# Patient Record
Sex: Male | Born: 1980 | Race: Black or African American | Hispanic: No | Marital: Married | Smoking: Former smoker
Health system: Southern US, Community
[De-identification: ages and names within clinical notes are randomized; demographics above are authoritative.]

## PROBLEM LIST (undated history)

## (undated) DIAGNOSIS — S93409A Sprain of unspecified ligament of unspecified ankle, initial encounter: Secondary | ICD-10-CM

## (undated) DIAGNOSIS — A549 Gonococcal infection, unspecified: Secondary | ICD-10-CM

---

## 1998-09-01 ENCOUNTER — Encounter: Payer: Self-pay | Admitting: Psychiatry

## 1998-09-01 ENCOUNTER — Emergency Department (HOSPITAL_COMMUNITY): Admission: EM | Admit: 1998-09-01 | Discharge: 1998-09-01 | Payer: Self-pay | Admitting: Emergency Medicine

## 2001-01-01 ENCOUNTER — Encounter: Payer: Self-pay | Admitting: Emergency Medicine

## 2001-01-01 ENCOUNTER — Emergency Department (HOSPITAL_COMMUNITY): Admission: EM | Admit: 2001-01-01 | Discharge: 2001-01-01 | Payer: Self-pay | Admitting: Emergency Medicine

## 2004-01-29 ENCOUNTER — Emergency Department (HOSPITAL_COMMUNITY): Admission: EM | Admit: 2004-01-29 | Discharge: 2004-01-29 | Payer: Self-pay | Admitting: Emergency Medicine

## 2004-08-06 ENCOUNTER — Emergency Department (HOSPITAL_COMMUNITY): Admission: EM | Admit: 2004-08-06 | Discharge: 2004-08-06 | Payer: Self-pay | Admitting: Emergency Medicine

## 2005-03-29 ENCOUNTER — Emergency Department (HOSPITAL_COMMUNITY): Admission: EM | Admit: 2005-03-29 | Discharge: 2005-03-29 | Payer: Self-pay | Admitting: Emergency Medicine

## 2005-07-24 ENCOUNTER — Emergency Department (HOSPITAL_COMMUNITY): Admission: EM | Admit: 2005-07-24 | Discharge: 2005-07-24 | Payer: Self-pay | Admitting: *Deleted

## 2005-09-20 ENCOUNTER — Emergency Department (HOSPITAL_COMMUNITY): Admission: EM | Admit: 2005-09-20 | Discharge: 2005-09-20 | Payer: Self-pay | Admitting: Emergency Medicine

## 2007-02-15 ENCOUNTER — Emergency Department (HOSPITAL_COMMUNITY): Admission: EM | Admit: 2007-02-15 | Discharge: 2007-02-15 | Payer: Self-pay | Admitting: Emergency Medicine

## 2008-02-03 ENCOUNTER — Emergency Department (HOSPITAL_COMMUNITY): Admission: EM | Admit: 2008-02-03 | Discharge: 2008-02-04 | Payer: Self-pay | Admitting: Emergency Medicine

## 2008-10-08 ENCOUNTER — Emergency Department (HOSPITAL_COMMUNITY): Admission: EM | Admit: 2008-10-08 | Discharge: 2008-10-08 | Payer: Self-pay | Admitting: Emergency Medicine

## 2010-09-24 ENCOUNTER — Emergency Department (HOSPITAL_COMMUNITY)
Admission: EM | Admit: 2010-09-24 | Discharge: 2010-09-24 | Disposition: A | Discharge status: Home / Self Care | Payer: Self-pay | Attending: Emergency Medicine | Admitting: Emergency Medicine

## 2010-09-24 DIAGNOSIS — N342 Other urethritis: Secondary | ICD-10-CM | POA: Insufficient documentation

## 2010-09-27 LAB — GC/CHLAMYDIA PROBE AMP, GENITAL
Chlamydia, DNA Probe: NEGATIVE
GC Probe Amp, Genital: POSITIVE — AB

## 2010-12-06 LAB — GC/CHLAMYDIA PROBE AMP, GENITAL
Chlamydia, DNA Probe: NEGATIVE
GC Probe Amp, Genital: POSITIVE — AB

## 2011-05-18 LAB — URINALYSIS, ROUTINE W REFLEX MICROSCOPIC
Bilirubin Urine: NEGATIVE
Glucose, UA: NEGATIVE
Hgb urine dipstick: NEGATIVE
Ketones, ur: NEGATIVE
Nitrite: NEGATIVE
Protein, ur: NEGATIVE
Specific Gravity, Urine: 1.025
Urobilinogen, UA: 1
pH: 7.5

## 2011-05-18 LAB — GC/CHLAMYDIA PROBE AMP, GENITAL
Chlamydia, DNA Probe: NEGATIVE
GC Probe Amp, Genital: POSITIVE — AB

## 2011-05-18 LAB — RPR: RPR Ser Ql: NONREACTIVE

## 2011-09-22 ENCOUNTER — Emergency Department (INDEPENDENT_AMBULATORY_CARE_PROVIDER_SITE_OTHER)
Admission: EM | Admit: 2011-09-22 | Discharge: 2011-09-22 | Disposition: A | Discharge status: Home / Self Care | Payer: Self-pay | Source: Home / Self Care | Attending: Emergency Medicine | Admitting: Emergency Medicine

## 2011-09-22 ENCOUNTER — Emergency Department (INDEPENDENT_AMBULATORY_CARE_PROVIDER_SITE_OTHER): Payer: Self-pay

## 2011-09-22 ENCOUNTER — Encounter (HOSPITAL_COMMUNITY): Payer: Self-pay | Admitting: *Deleted

## 2011-09-22 DIAGNOSIS — M948X9 Other specified disorders of cartilage, unspecified sites: Secondary | ICD-10-CM

## 2011-09-22 DIAGNOSIS — M2580 Other specified joint disorders, unspecified joint: Secondary | ICD-10-CM

## 2011-09-22 HISTORY — DX: Sprain of unspecified ligament of unspecified ankle, initial encounter: S93.409A

## 2011-09-22 MED ORDER — HYDROCODONE-ACETAMINOPHEN 5-325 MG PO TABS: 2.0000 | ORAL_TABLET | ORAL | Status: AC | PRN

## 2011-09-22 MED ORDER — IBUPROFEN 800 MG PO TABS: 800.0000 mg | ORAL_TABLET | Freq: Three times a day (TID) | ORAL | Status: AC | PRN

## 2011-09-22 NOTE — ED Provider Notes (Addendum)
History     CSN: [id number]  Arrival date & time 09/22/11  1733   First MD Initiated Contact with Patient 09/22/11 1744      Chief Complaint  Patient presents with  . Foot Pain    (Consider location/radiation/quality/duration/timing/severity/associated sxs/prior treatment) HPI Comments: Patient states that he started running recently. States he was running in an old pair shoes on a concrete sidewalk, now complaints of achy pain, swelling at left first MTP joint. No hyperesthesias, redness, bruising, numbness. Is able to move all toes, although some limitation of motion of the first toe due to pain. No pain along ankle, along plantar fascia, midfoot, or fifth metatarsal. Patient tried 400 mg of ibuprofen without relief. No history of gout.  Patient is a 31 y.o. male presenting with lower extremity pain.  Foot Pain The current episode started 2 days ago. The problem occurs constantly. The problem has not changed since onset.The symptoms are aggravated by walking. The symptoms are relieved by nothing.    Past Medical History  Diagnosis Date  . Ankle sprain     History reviewed. No pertinent past surgical history.  History reviewed. No pertinent family history.  History  Substance Use Topics  . Smoking status: Current Everyday Smoker -- 1.0 packs/day    Types: Cigarettes  . Smokeless tobacco: Not on file  . Alcohol Use: Yes     occassional      Review of Systems  Constitutional: Negative for fever.  Gastrointestinal: Negative for nausea.  Musculoskeletal: Positive for joint swelling and arthralgias.  Skin: Negative for color change and wound.  Neurological: Negative for numbness.    Allergies  Review of patient's allergies indicates no known allergies.  Home Medications   Current Outpatient Rx  Name Route Sig Dispense Refill  . HYDROCODONE-ACETAMINOPHEN 5-325 MG PO TABS Oral Take 2 tablets by mouth every 4 (four) hours as needed for pain. 20 tablet 0  . IBUPROFEN  800 MG PO TABS Oral Take 1 tablet (800 mg total) by mouth every 8 (eight) hours as needed for pain. 30 tablet 0    BP 135/80  Pulse 82  Temp(Src) 98.7 F (37.1 C) (Oral)  Resp 18  SpO2 100%  Physical Exam  Nursing note and vitals reviewed. Constitutional: He is oriented to person, place, and time. He appears well-developed and well-nourished.  HENT:  Head: Normocephalic and atraumatic.  Eyes: EOM are normal.  Neck: Normal range of motion.  Cardiovascular: Normal rate.   Pulmonary/Chest: Effort normal. No respiratory distress.  Abdominal: He exhibits no distension.  Musculoskeletal: Normal range of motion.       Pain, swelling at left first MTP. Pain on plantar aspect of first MTP. No pain on dorsal aspect. Patient able to move toes, however, pain with passive flexion/extension of great toe. Ankle, midfoot, fifth metatarsal all within normal limits. DP 2+. No bruising. Skin intact.  Neurological: He is alert and oriented to person, place, and time.  Skin: Skin is warm and dry.  Psychiatric: He has a normal mood and affect. His behavior is normal.    ED Course  Procedures (including critical care time)  Labs Reviewed - No data to display Dg Foot Complete Left  09/22/2011  *RADIOLOGY REPORT*  Clinical Data: Pain possible running injury.  LEFT FOOT - COMPLETE 3+ VIEW  Comparison: None.  Findings: No acute bony or joint abnormality is seen. Bipartite medial sesamoid is noted.  IMPRESSION: Negative exam.  Original Report Authenticated By: Bernadene Bell. Maricela Curet, M.D.  1. Sesamoiditis       MDM  Imaging reviewed by myself. Report per radiologist. Appears to have a bipartate sesamoid. No acute fracture per radiology. Will place in crutches, ace wrap, stiff soled shoe. Will have him followup with orthopedics in 10 days if no improvement with conservative treatment. Discussed imaging and fact that he may have a stress fracture not seen on today's x-ray, & plan with patient. Patient  agrees with plan.   Luiz Blare, MD 09/22/11 1846  Luiz Blare, MD 09/22/11 1910

## 2011-09-22 NOTE — ED Notes (Signed)
Report given to Charlesetta Garibaldi, RN

## 2011-09-22 NOTE — ED Notes (Signed)
Pt states that he started running Wednesday (had not done it in a while).  Afterwards noted that he was having pain ball of left foot.  States pain is progressively getting worse, throbbing and can't bear weight on it.

## 2013-03-12 ENCOUNTER — Emergency Department (HOSPITAL_COMMUNITY)
Admission: EM | Admit: 2013-03-12 | Discharge: 2013-03-12 | Disposition: A | Discharge status: Home / Self Care | Payer: BC Managed Care – PPO | Source: Home / Self Care | Attending: Family Medicine | Admitting: Family Medicine

## 2013-03-12 ENCOUNTER — Emergency Department (HOSPITAL_COMMUNITY)
Admission: EM | Admit: 2013-03-12 | Discharge: 2013-03-12 | Disposition: A | Discharge status: Home / Self Care | Payer: Self-pay

## 2013-03-12 ENCOUNTER — Encounter (HOSPITAL_COMMUNITY): Payer: Self-pay

## 2013-03-12 DIAGNOSIS — K047 Periapical abscess without sinus: Secondary | ICD-10-CM

## 2013-03-12 MED ORDER — CLINDAMYCIN HCL 300 MG PO CAPS: 300.0000 mg | ORAL_CAPSULE | Freq: Three times a day (TID) | ORAL | Status: DC

## 2013-03-12 MED ORDER — DICLOFENAC POTASSIUM 50 MG PO TABS: 50.0000 mg | ORAL_TABLET | Freq: Three times a day (TID) | ORAL | Status: DC

## 2013-03-12 NOTE — ED Provider Notes (Signed)
   History    CSN: [id number] Arrival date & time 03/12/13  1321  First MD Initiated Contact with Patient 03/12/13 1419     Chief Complaint  Patient presents with  . Dental Problem   (Consider location/radiation/quality/duration/timing/severity/associated sxs/prior Treatment) Patient is a 32 y.o. male presenting with tooth pain. The history is provided by the patient.  Dental Pain Location:  Lower Lower teeth location:  19/LL 1st molar Quality:  Throbbing Severity:  Moderate Onset quality:  Sudden Duration:  1 day Progression:  Worsening Chronicity:  New Context: abscess and dental caries   Associated symptoms: facial swelling   Risk factors: lack of dental care and smoking    Past Medical History  Diagnosis Date  . Ankle sprain    History reviewed. No pertinent past surgical history. History reviewed. No pertinent family history. History  Substance Use Topics  . Smoking status: Current Every Day Smoker -- 1.00 packs/day    Types: Cigarettes  . Smokeless tobacco: Not on file  . Alcohol Use: Yes     Comment: occassional    Review of Systems  Constitutional: Negative.   HENT: Positive for facial swelling and dental problem.     Allergies  Review of patient's allergies indicates no known allergies.  Home Medications   Current Outpatient Rx  Name  Route  Sig  Dispense  Refill  . clindamycin (CLEOCIN) 300 MG capsule   Oral   Take 1 capsule (300 mg total) by mouth 3 (three) times daily.   21 capsule   0   . diclofenac (CATAFLAM) 50 MG tablet   Oral   Take 1 tablet (50 mg total) by mouth 3 (three) times daily.   30 tablet   0    BP 135/88  Pulse 73  Temp(Src) 98 F (36.7 C) (Oral)  Resp 16  SpO2 100% Physical Exam  Nursing note and vitals reviewed. Constitutional: He is oriented to person, place, and time. He appears well-developed and well-nourished. He appears distressed.  HENT:  Left Ear: External ear normal.  Mouth/Throat: Oropharynx is clear  and moist. Abnormal dentition. Dental abscesses and dental caries present.    Eyes: Conjunctivae are normal. Pupils are equal, round, and reactive to light.  Neck: Normal range of motion. Neck supple.  Lymphadenopathy:    He has cervical adenopathy.  Neurological: He is alert and oriented to person, place, and time.  Skin: Skin is warm and dry.    ED Course  Procedures (including critical care time) Labs Reviewed - No data to display No results found. 1. Dental abscess     MDM    Linna Hoff, MD 03/12/13 [phone]

## 2013-03-12 NOTE — ED Notes (Signed)
Reports 2 day duration of pain and swelling left lower jaw; minimal relief w motrin

## 2014-09-27 ENCOUNTER — Emergency Department (HOSPITAL_COMMUNITY)
Admission: EM | Admit: 2014-09-27 | Discharge: 2014-09-27 | Disposition: A | Discharge status: Home / Self Care | Payer: Self-pay | Attending: Emergency Medicine | Admitting: Emergency Medicine

## 2014-09-27 ENCOUNTER — Encounter (HOSPITAL_COMMUNITY): Payer: Self-pay | Admitting: Emergency Medicine

## 2014-09-27 DIAGNOSIS — Z720 Tobacco use: Secondary | ICD-10-CM | POA: Insufficient documentation

## 2014-09-27 DIAGNOSIS — Z791 Long term (current) use of non-steroidal anti-inflammatories (NSAID): Secondary | ICD-10-CM | POA: Insufficient documentation

## 2014-09-27 DIAGNOSIS — Z87828 Personal history of other (healed) physical injury and trauma: Secondary | ICD-10-CM | POA: Insufficient documentation

## 2014-09-27 DIAGNOSIS — R0981 Nasal congestion: Secondary | ICD-10-CM | POA: Insufficient documentation

## 2014-09-27 DIAGNOSIS — R05 Cough: Secondary | ICD-10-CM | POA: Insufficient documentation

## 2014-09-27 DIAGNOSIS — Z792 Long term (current) use of antibiotics: Secondary | ICD-10-CM | POA: Insufficient documentation

## 2014-09-27 MED ORDER — PSEUDOEPHEDRINE HCL ER 120 MG PO TB12
120.0000 mg | ORAL_TABLET | Freq: Two times a day (BID) | ORAL | Status: DC
  Administered 2014-09-27: 120 mg via ORAL
  Filled 2014-09-27: qty 1

## 2014-09-27 MED ORDER — PSEUDOEPHEDRINE HCL ER 120 MG PO TB12: 120.0000 mg | ORAL_TABLET | Freq: Two times a day (BID) | ORAL | Status: AC

## 2014-09-27 NOTE — ED Notes (Signed)
Pt states "I've had a cold for about 2 weeks and it doesn't seem to be going away." Pt c/o nasal congestion, sore throat, and productive cough, states he is coughing up green mucus. Denies chest pain or SOB.

## 2014-09-27 NOTE — ED Provider Notes (Signed)
CSN: [id number]     Arrival date & time 09/27/14  0017 History   First MD Initiated Contact with Patient 09/27/14 0028     Chief Complaint  Patient presents with  . Nasal Congestion     (Consider location/radiation/quality/duration/timing/severity/associated sxs/prior Treatment) HPI Comments: Patient has had URI symptoms proximally 2 weeks.  He has 7 the last 24 hours, that when he wakes in the morning.  His, puffy right eye with some crusting of the lid margin.  This does not occur during the day while he is awake.  He also notices some facial discomfort when leaning over.  He has not taken any medication for his URI symptoms.  Denies any fever.  Does state that he has an occasional productive cough but no wheezing  The history is provided by the patient.    Past Medical History  Diagnosis Date  . Ankle sprain    History reviewed. No pertinent past surgical history. No family history on file. History  Substance Use Topics  . Smoking status: Current Every Day Smoker -- 0.50 packs/day    Types: Cigarettes  . Smokeless tobacco: Not on file  . Alcohol Use: Yes     Comment: occassional    Review of Systems  Constitutional: Negative for fever.  HENT: Positive for congestion.   Eyes: Negative for visual disturbance.  Respiratory: Positive for cough. Negative for shortness of breath and wheezing.   Neurological: Negative for dizziness and headaches.      Allergies  Review of patient's allergies indicates no known allergies.  Home Medications   Prior to Admission medications   Medication Sig Start Date End Date Taking? Authorizing Provider  clindamycin (CLEOCIN) 300 MG capsule Take 1 capsule (300 mg total) by mouth 3 (three) times daily. 03/12/13   Linna HoffJames D Kindl, MD  diclofenac (CATAFLAM) 50 MG tablet Take 1 tablet (50 mg total) by mouth 3 (three) times daily. 03/12/13   Linna HoffJames D Kindl, MD  pseudoephedrine (SUDAFED) 120 MG 12 hr tablet Take 1 tablet (120 mg total) by mouth 2  (two) times daily. 09/27/14 10/04/14  Arman FilterGail K Chole Driver, NP   BP 144/79 mmHg  Pulse 95  Temp(Src) 98.3 F (36.8 C) (Oral)  Resp 16  Ht 5\' 9"  (1.753 m)  Wt 235 lb (106.595 kg)  BMI 34.69 kg/m2  SpO2 98% Physical Exam  Constitutional: He appears well-developed and well-nourished.  HENT:  Head: Normocephalic.  Right Ear: External ear normal.  Left Ear: External ear normal.  Nose: Left sinus exhibits frontal sinus tenderness.  Eyes: Pupils are equal, round, and reactive to light.  Neck: Normal range of motion.  Cardiovascular: Normal rate.   Pulmonary/Chest: Effort normal. He has no wheezes.  Musculoskeletal: Normal range of motion.  Lymphadenopathy:    He has no cervical adenopathy.  Neurological: He is alert.  Nursing note and vitals reviewed.   ED Course  Procedures (including critical care time) Labs Review Labs Reviewed - No data to display  Imaging Review No results found.   EKG Interpretation None     Will give Rx for Sudafed  MDM   Final diagnoses:  Sinus congestion        Arman FilterGail K Kaliyah Gladman, NP 09/27/14 [id number]  Raeford RazorStephen Kohut, MD 09/27/14 [phone]

## 2015-11-25 ENCOUNTER — Emergency Department (HOSPITAL_COMMUNITY)
Admission: EM | Admit: 2015-11-25 | Discharge: 2015-11-25 | Disposition: A | Discharge status: Home / Self Care | Payer: Self-pay | Attending: Emergency Medicine | Admitting: Emergency Medicine

## 2015-11-25 ENCOUNTER — Emergency Department (HOSPITAL_COMMUNITY): Payer: Self-pay

## 2015-11-25 ENCOUNTER — Encounter (HOSPITAL_COMMUNITY): Payer: Self-pay | Admitting: Emergency Medicine

## 2015-11-25 DIAGNOSIS — M25572 Pain in left ankle and joints of left foot: Secondary | ICD-10-CM | POA: Insufficient documentation

## 2015-11-25 DIAGNOSIS — Z87828 Personal history of other (healed) physical injury and trauma: Secondary | ICD-10-CM | POA: Insufficient documentation

## 2015-11-25 DIAGNOSIS — Z791 Long term (current) use of non-steroidal anti-inflammatories (NSAID): Secondary | ICD-10-CM | POA: Insufficient documentation

## 2015-11-25 DIAGNOSIS — Z792 Long term (current) use of antibiotics: Secondary | ICD-10-CM | POA: Insufficient documentation

## 2015-11-25 DIAGNOSIS — F17210 Nicotine dependence, cigarettes, uncomplicated: Secondary | ICD-10-CM | POA: Insufficient documentation

## 2015-11-25 MED ORDER — IBUPROFEN 800 MG PO TABS: 800.0000 mg | ORAL_TABLET | Freq: Three times a day (TID) | ORAL | Status: DC

## 2015-11-25 NOTE — ED Provider Notes (Signed)
CSN: [id number]     Arrival date & time 11/25/15  1155 History   First MD Initiated Contact with Patient 11/25/15 1235     No chief complaint on file.    (Consider location/radiation/quality/duration/timing/severity/associated sxs/prior Treatment) HPI   35 year old male who presents with complaints of left foot pain. Patient noticed intermittent pain to the joint near the left great toe for the past 4 days. Pain is described as a sharp aching sensation worsening with ambulation and improves with rest. Pain is nonradiating and no specific treatment tried. He denies any injury but states that he has been working out and thinks he may have overdone it. No fever or rash. No history of gout.  Past Medical History  Diagnosis Date  . Ankle sprain    History reviewed. No pertinent past surgical history. No family history on file. Social History  Substance Use Topics  . Smoking status: Current Every Day Smoker -- 0.50 packs/day    Types: Cigarettes  . Smokeless tobacco: None  . Alcohol Use: Yes     Comment: occassional    Review of Systems  Constitutional: Negative for fever.  Musculoskeletal: Positive for arthralgias.  Neurological: Negative for numbness.      Allergies  Review of patient's allergies indicates no known allergies.  Home Medications   Prior to Admission medications   Medication Sig Start Date End Date Taking? Authorizing Provider  clindamycin (CLEOCIN) 300 MG capsule Take 1 capsule (300 mg total) by mouth 3 (three) times daily. 03/12/13   Linna Hoff, MD  diclofenac (CATAFLAM) 50 MG tablet Take 1 tablet (50 mg total) by mouth 3 (three) times daily. 03/12/13   Linna Hoff, MD   BP 135/94 mmHg  Pulse 85  Temp(Src) 97.9 F (36.6 C) (Oral)  Resp 16  Ht  (1.753 m)  Wt 115.667 kg  BMI 37.64 kg/m2  SpO2 98% Physical Exam  Constitutional: He appears well-developed and well-nourished. No distress.  HENT:  Head: Atraumatic.  Eyes: Conjunctivae are normal.   Neck: Neck supple.  Musculoskeletal: He exhibits tenderness (L foot: mild tenderness to 1st Metatarsal joint on palpation.  no erythema, no edema, no deformity.  brisk cap refills to all toes, dorsalis pedis pulse palpable.  ).  Neurological: He is alert.  Skin: No rash noted.  Psychiatric: He has a normal mood and affect.  Nursing note and vitals reviewed.   ED Course  Procedures (including critical care time) Labs Review Labs Reviewed - No data to display  Imaging Review Dg Foot Complete Left  11/25/2015  CLINICAL DATA:  Left foot pain for 4 days EXAM: LEFT FOOT - COMPLETE 3+ VIEW COMPARISON:  09/22/2011 FINDINGS: No acute fracture or dislocation is noted. A well corticated bony density is noted adjacent to the medial malleolus consistent with prior trauma and nonunion. No soft tissue abnormality is seen. IMPRESSION: No acute abnormality noted. Electronically Signed   By: Alcide Clever M.D.   On: 11/25/2015 13:02   I have personally reviewed and evaluated these images and lab results as part of my medical decision-making.   EKG Interpretation None      MDM   Final diagnoses:  Arthralgia of left foot   BP 135/94 mmHg  Pulse 85  Temp(Src) 97.9 F (36.6 C) (Oral)  Resp 16  Ht  (1.753 m)  Wt 115.667 kg  BMI 37.64 kg/m2  SpO2 98%  Pt has tenderness to 1st MTJ without signs of gout or deformity.  Xray neg.  Will treat arthralgias.  Return precaution discussed     Fayrene HelperBowie Joury Allcorn, PA-C 11/25/15 1317  Benjiman CoreNathan Pickering, MD 11/26/15 [phone]

## 2015-11-25 NOTE — ED Notes (Signed)
C/o left foot pain x 4 days. Thinks it from working out.

## 2015-11-25 NOTE — Discharge Instructions (Signed)

## 2016-05-25 ENCOUNTER — Emergency Department (HOSPITAL_COMMUNITY)
Admission: EM | Admit: 2016-05-25 | Discharge: 2016-05-25 | Disposition: A | Discharge status: Home / Self Care | Payer: Self-pay | Attending: Emergency Medicine | Admitting: Emergency Medicine

## 2016-05-25 ENCOUNTER — Encounter (HOSPITAL_COMMUNITY): Payer: Self-pay

## 2016-05-25 DIAGNOSIS — M79661 Pain in right lower leg: Secondary | ICD-10-CM

## 2016-05-25 DIAGNOSIS — Z79899 Other long term (current) drug therapy: Secondary | ICD-10-CM | POA: Insufficient documentation

## 2016-05-25 DIAGNOSIS — M79604 Pain in right leg: Secondary | ICD-10-CM | POA: Insufficient documentation

## 2016-05-25 DIAGNOSIS — F17210 Nicotine dependence, cigarettes, uncomplicated: Secondary | ICD-10-CM | POA: Insufficient documentation

## 2016-05-25 MED ORDER — HYDROCODONE-ACETAMINOPHEN 5-325 MG PO TABS: 2.0000 | ORAL_TABLET | ORAL | 0 refills | Status: DC | PRN

## 2016-05-25 MED ORDER — METHOCARBAMOL 500 MG PO TABS: 500.0000 mg | ORAL_TABLET | Freq: Four times a day (QID) | ORAL | 0 refills | Status: DC

## 2016-05-25 MED ORDER — ENOXAPARIN SODIUM 120 MG/0.8ML ~~LOC~~ SOLN
1.0000 mg/kg | Freq: Once | SUBCUTANEOUS | Status: AC
  Administered 2016-05-25: 105 mg via SUBCUTANEOUS
  Filled 2016-05-25: qty 0.8

## 2016-05-25 NOTE — Progress Notes (Signed)
EDCM spoke to patient at bedside. Patient confirms she does not have a pcp or insurance living in [place].  EDCM provided patient with contact infromation to [facility], informed patient of services there and walk in times.  EDCM also provided patient with list of pcps who accept self pay patients, list of discount pharmacies and websites needymeds.org and GoodRX.com for medication assistance, phone number to inquire about the orange card, phone number to inquire about Medicaid, phone number to inquire about the Affordable Care Act, financial resources in the community such as local churches, [employer], urban ministries, and dental assistance for uninsured patients.  Patient thankful for resources.  No further EDCM needs at this time. 

## 2016-05-25 NOTE — ED Triage Notes (Signed)
Pt complains of right lower leg pain for a week, he thinks he injured something lifting weights, his leg is sore and swollen

## 2016-05-25 NOTE — ED Provider Notes (Signed)
[facility]-EMERGENCY DEPT Provider Note   CSN: [id number] Arrival date & time: 05/25/16  2006     History   Chief Complaint Chief Complaint  Patient presents with  . Leg Pain    HPI Seth Cunningham is a 35 y.o. male.  35 year old male complains of severe right calf pain and began week and half ago after he was doing leg extensions at the gym. Patient also states he was doing a squat 500 pounds and thinks he could've also injured it that way as well 2. Denies any knee pain. No hip or back pain. Pain is localized to his right calf and now has swelling associated with it. States he has some pain with plantar foot flexion. Denies any shortness of breath or pleuritic chest pain. Pain better with rest. Using over-the-counter medications without relief      Past Medical History:  Diagnosis Date  . Ankle sprain     There are no active problems to display for this patient.   History reviewed. No pertinent surgical history.     Home Medications    Prior to Admission medications   Medication Sig Start Date End Date Taking? Authorizing Provider  clindamycin (CLEOCIN) 300 MG capsule Take 1 capsule (300 mg total) by mouth 3 (three) times daily. Patient not taking: Reported on 05/25/2016 03/12/13   Linna Hoff, MD  diclofenac (CATAFLAM) 50 MG tablet Take 1 tablet (50 mg total) by mouth 3 (three) times daily. Patient not taking: Reported on 05/25/2016 03/12/13   Linna Hoff, MD  ibuprofen (ADVIL,MOTRIN) 800 MG tablet Take 1 tablet (800 mg total) by mouth 3 (three) times daily. Patient not taking: Reported on 05/25/2016 11/25/15   Fayrene Helper, PA-C    Family History History reviewed. No pertinent family history.  Social History Social History  Substance Use Topics  . Smoking status: Current Every Day Smoker    Packs/day: 0.50    Types: Cigarettes  . Smokeless tobacco: Never Used  . Alcohol use Yes     Comment: occassional     Allergies   Review of patient's allergies indicates  no known allergies.   Review of Systems Review of Systems  All other systems reviewed and are negative.    Physical Exam Updated Vital Signs BP (!) 118/47   Pulse 70   Temp 97.9 F (36.6 C) (Oral)   Resp 18   SpO2 97%   Physical Exam  Constitutional: He is oriented to person, place, and time. He appears well-developed and well-nourished.  Non-toxic appearance. No distress.  HENT:  Head: Normocephalic and atraumatic.  Eyes: Conjunctivae, EOM and lids are normal. Pupils are equal, round, and reactive to light.  Neck: Normal range of motion. Neck supple. No tracheal deviation present. No thyroid mass present.  Cardiovascular: Normal rate, regular rhythm and normal heart sounds.  Exam reveals no gallop.   No murmur heard. Pulmonary/Chest: Effort normal and breath sounds normal. No stridor. No respiratory distress. He has no decreased breath sounds. He has no wheezes. He has no rhonchi. He has no rales.  Abdominal: Soft. Normal appearance and bowel sounds are normal. He exhibits no distension. There is no tenderness. There is no rebound and no CVA tenderness.  Musculoskeletal: Normal range of motion. He exhibits no edema or tenderness.       Right lower leg: He exhibits swelling.       Legs: Edema noted from the right calf down to the ankle.  Neurological: He is alert and oriented to  person, place, and time. He has normal strength. No cranial nerve deficit or sensory deficit. GCS eye subscore is 4. GCS verbal subscore is 5. GCS motor subscore is 6.  Skin: Skin is warm and dry. No abrasion and no rash noted.  Psychiatric: He has a normal mood and affect. His speech is normal and behavior is normal.  Nursing note and vitals reviewed.    ED Treatments / Results  Labs (all labs ordered are listed, but only abnormal results are displayed) Labs Reviewed - No data to display  EKG  EKG Interpretation None       Radiology No results found.  Procedures Procedures (including  critical care time)  Medications Ordered in ED Medications - No data to display   Initial Impression / Assessment and Plan / ED Course  I have reviewed the triage vital signs and the nursing notes.  Pertinent labs & imaging results that were available during my care of the patient were reviewed by me and considered in my medical decision making (see chart for details).  Clinical Course    Patient likely with calf Tendon injury. However concern for possible DVT. Will medicate Lovenox here and have patient return to [place] for an outpatient Doppler study  Final Clinical Impressions(s) / ED Diagnoses   Final diagnoses:  None    New Prescriptions New Prescriptions   No medications on file     Lorre NickAnthony Hester Forget, MD 05/25/16 2109

## 2016-05-26 ENCOUNTER — Ambulatory Visit (HOSPITAL_COMMUNITY)
Admission: RE | Admit: 2016-05-26 | Discharge: 2016-05-26 | Disposition: A | Discharge status: Home / Self Care | Payer: Self-pay | Source: Ambulatory Visit | Attending: Emergency Medicine | Admitting: Emergency Medicine

## 2016-05-26 DIAGNOSIS — M79609 Pain in unspecified limb: Secondary | ICD-10-CM

## 2016-05-26 DIAGNOSIS — M79661 Pain in right lower leg: Secondary | ICD-10-CM | POA: Insufficient documentation

## 2016-05-26 NOTE — Progress Notes (Signed)
VASCULAR LAB PRELIMINARY  PRELIMINARY  PRELIMINARY  PRELIMINARY  Right lower extremity venous duplex completed.    Preliminary report:  There is no obvious evidence of DVT or SVT noted in the right lower extremity.   Janne Faulk, RVT 05/26/2016, 9:11 AM

## 2016-06-08 ENCOUNTER — Emergency Department (HOSPITAL_COMMUNITY)
Admission: EM | Admit: 2016-06-08 | Discharge: 2016-06-09 | Disposition: A | Discharge status: Home / Self Care | Payer: Self-pay | Attending: Dermatology | Admitting: Dermatology

## 2016-06-08 ENCOUNTER — Encounter (HOSPITAL_COMMUNITY): Payer: Self-pay | Admitting: *Deleted

## 2016-06-08 DIAGNOSIS — M25561 Pain in right knee: Secondary | ICD-10-CM | POA: Insufficient documentation

## 2016-06-08 DIAGNOSIS — Z87891 Personal history of nicotine dependence: Secondary | ICD-10-CM | POA: Insufficient documentation

## 2016-06-08 DIAGNOSIS — Z5321 Procedure and treatment not carried out due to patient leaving prior to being seen by health care provider: Secondary | ICD-10-CM | POA: Insufficient documentation

## 2016-06-08 NOTE — ED Notes (Signed)
Bed: [facility] Expected date:  Expected time:  Means of arrival:  Comments: 

## 2016-06-08 NOTE — ED Notes (Signed)
Bed: [facility] Expected date:  Expected time:  Means of arrival:  Comments: 

## 2016-06-08 NOTE — ED Triage Notes (Signed)
Pt called from triage, no answer 

## 2016-06-08 NOTE — ED Triage Notes (Signed)
Patient is alert and oriented x4.  He is complaining of right knee pain that started about 2 weeks ago.  Patient states that he thinks it happened while working out.  Patient adds that he feels swelling in the knee with decreased range of motion.  Currently he rates his pain 6 of 10.

## 2016-06-15 ENCOUNTER — Encounter (HOSPITAL_COMMUNITY): Payer: Self-pay | Admitting: Emergency Medicine

## 2016-06-15 ENCOUNTER — Emergency Department (HOSPITAL_COMMUNITY)
Admission: EM | Admit: 2016-06-15 | Discharge: 2016-06-15 | Disposition: A | Discharge status: Home / Self Care | Payer: Self-pay | Attending: Emergency Medicine | Admitting: Emergency Medicine

## 2016-06-15 DIAGNOSIS — Z87891 Personal history of nicotine dependence: Secondary | ICD-10-CM | POA: Insufficient documentation

## 2016-06-15 DIAGNOSIS — Y9389 Activity, other specified: Secondary | ICD-10-CM | POA: Insufficient documentation

## 2016-06-15 DIAGNOSIS — Z79899 Other long term (current) drug therapy: Secondary | ICD-10-CM | POA: Insufficient documentation

## 2016-06-15 DIAGNOSIS — Y999 Unspecified external cause status: Secondary | ICD-10-CM | POA: Insufficient documentation

## 2016-06-15 DIAGNOSIS — Y929 Unspecified place or not applicable: Secondary | ICD-10-CM | POA: Insufficient documentation

## 2016-06-15 DIAGNOSIS — X58XXXA Exposure to other specified factors, initial encounter: Secondary | ICD-10-CM | POA: Insufficient documentation

## 2016-06-15 DIAGNOSIS — S8991XA Unspecified injury of right lower leg, initial encounter: Secondary | ICD-10-CM | POA: Insufficient documentation

## 2016-06-15 NOTE — ED Triage Notes (Signed)
Pt states that he cannot fully extend rt knee due to pain.  Started hurting one month ago.  Pt states that he is unable to do any "explosive energy" exercises.  Pt is Pharmacist, [employer].

## 2016-06-15 NOTE — Discharge Instructions (Signed)
Read the information below.  You may return to the Emergency Department at any time for worsening condition or any new symptoms that concern you.  If you develop uncontrolled pain, weakness or numbness of the extremity, severe discoloration of the skin, or you are unable to walk or move your knee, return to the ER for a recheck.    °

## 2016-06-15 NOTE — ED Provider Notes (Signed)
[facility]-EMERGENCY DEPT Provider Note   CSN: [id number] Arrival date & time: 06/15/16  1455  By signing my name below, I, Soijett Blue, attest that this documentation has been prepared under the direction and in the presence of Trixie Dredge, PA-C Electronically Signed: Soijett Blue, ED Scribe. 06/15/16. 3:42 PM.   History   Chief Complaint Chief Complaint  Patient presents with  . Knee Pain    HPI Seth Cunningham is a 35 y.o. male who presents to the Emergency Department complaining of right knee pain onset 1 month ago. Pt notes that he was completing squats prior to the onset of his symptoms and he denies overexerting himself, but notes that he was rushing through the squats. Pt reports that he feels a "catch" sensation to his right knee with ambulation and he notes that he also has a tugging sensation to his right knee when he attempts squatting. Denies recent infections or treatment. Denies recent falls. Pt is having associated symptoms of mild right knee swelling and right lower leg swelling that occurs only at night. He notes that he has not tried any medications for the relief of his symptoms. He denies color change, wound, rash, fever, weakness, numbness, CP, SOB, and any other symptoms. Pt denies having a PCP or seeing an orthopedist in the past.    The history is provided by the patient. No language interpreter was used.    Past Medical History:  Diagnosis Date  . Ankle sprain     There are no active problems to display for this patient.   No past surgical history on file.     Home Medications    Prior to Admission medications   Medication Sig Start Date End Date Taking? Authorizing Provider  clindamycin (CLEOCIN) 300 MG capsule Take 1 capsule (300 mg total) by mouth 3 (three) times daily. Patient not taking: Reported on 05/25/2016 03/12/13   Linna Hoff, MD  diclofenac (CATAFLAM) 50 MG tablet Take 1 tablet (50 mg total) by mouth 3 (three) times daily. Patient not  taking: Reported on 05/25/2016 03/12/13   Linna Hoff, MD  HYDROcodone-acetaminophen (NORCO/VICODIN) 5-325 MG tablet Take 2 tablets by mouth every 4 (four) hours as needed. 05/25/16   Lorre Nick, MD  ibuprofen (ADVIL,MOTRIN) 800 MG tablet Take 1 tablet (800 mg total) by mouth 3 (three) times daily. Patient not taking: Reported on 05/25/2016 11/25/15   Fayrene Helper, PA-C  methocarbamol (ROBAXIN) 500 MG tablet Take 1 tablet (500 mg total) by mouth 4 (four) times daily. 05/25/16   Lorre Nick, MD    Family History No family history on file.  Social History Social History  Substance Use Topics  . Smoking status: Former Smoker    Packs/day: 0.50    Types: Cigarettes  . Smokeless tobacco: Never Used  . Alcohol use Yes     Comment: occassional     Allergies   Review of patient's allergies indicates no known allergies.   Review of Systems Review of Systems  Constitutional: Negative for fever.  Respiratory: Negative for shortness of breath.   Cardiovascular: Negative for chest pain.  Musculoskeletal: Positive for arthralgias (right knee) and joint swelling (right knee and right lower leg).  Skin: Negative for color change, rash and wound.  Neurological: Negative for weakness.     Physical Exam Updated Vital Signs BP 152/83 (BP Location: Right Arm)   Pulse 77   Temp 98 F (36.7 C) (Oral)   Resp 18   SpO2 97%   Physical  Exam  Constitutional: He appears well-developed and well-nourished. No distress.  HENT:  Head: Normocephalic and atraumatic.  Neck: Neck supple.  Pulmonary/Chest: Effort normal.  Musculoskeletal:       Right knee: He exhibits no erythema, no LCL laxity and no MCL laxity. Tenderness found.  Right knee without tenderness. Mild edema at the inferior lateral edge of the patella. No erythema or warmth. No pain with anterior or posterior drawer test or varus or valgus testing. No laxity of joint. +thessaly test.  Neurological: He is alert.  Skin: He is not  diaphoretic.  Nursing note and vitals reviewed.    ED Treatments / Results  DIAGNOSTIC STUDIES: Oxygen Saturation is 95% on RA, adequate by my interpretation.    COORDINATION OF CARE: 3:33 PM Discussed treatment plan with pt at bedside which includes referral and follow up with orthopedist, and pt agreed to plan.   Procedures Procedures (including critical care time)  Medications Ordered in ED Medications - No data to display   Initial Impression / Assessment and Plan / ED Course  I have reviewed the triage vital signs and the nursing notes.   Clinical Course   Afebrile, nontoxic patient with injury to his right knee while doing squats several weeks ago.   Xray not emergently indicated.  Question meniscus injury.   D/C home with orthopedic follow up.  Discussed result, findings, treatment, and follow up  with patient.  Pt given return precautions.  Pt verbalizes understanding and agrees with plan.      Final Clinical Impressions(s) / ED Diagnoses   Final diagnoses:  Injury of right knee, initial encounter    New Prescriptions Discharge Medication List as of 06/15/2016  3:39 PM     I personally performed the services described in this documentation, which was scribed in my presence. The recorded information has been reviewed and is accurate.    Trixie Dredgemily Matis Monnier, PA-C 06/15/16 1613    Rolland PorterMark James, MD 06/22/16 [phone]

## 2016-07-04 ENCOUNTER — Ambulatory Visit (INDEPENDENT_AMBULATORY_CARE_PROVIDER_SITE_OTHER): Payer: Self-pay

## 2016-07-04 ENCOUNTER — Ambulatory Visit (INDEPENDENT_AMBULATORY_CARE_PROVIDER_SITE_OTHER): Payer: Self-pay | Admitting: Orthopaedic Surgery

## 2016-07-04 ENCOUNTER — Encounter (INDEPENDENT_AMBULATORY_CARE_PROVIDER_SITE_OTHER): Payer: Self-pay | Admitting: Orthopaedic Surgery

## 2016-07-04 DIAGNOSIS — M25561 Pain in right knee: Secondary | ICD-10-CM

## 2016-07-04 NOTE — Progress Notes (Signed)
   Office Visit Note   Patient: Seth DibbleJohnathan Cunningham           Date of Birth: 03-31-1981           MRN: [id number] Visit Date: 07/04/2016              Requested by: No referring provider defined for this encounter. PCP: No PCP Per Patient   Assessment & Plan: Visit Diagnoses:  1. Acute pain of right knee     Plan: MRI of right knee order to evaluate for anterior cruciate ligament tear. Follow-up after MRI.  Follow-Up Instructions: Return in about 2 weeks (around 07/18/2016) for review MRI.   Orders:  Orders Placed This Encounter  Procedures  . XR KNEE 3 VIEW RIGHT  . MR Knee Right w/o contrast   No orders of the defined types were placed in this encounter.     Procedures: No procedures performed   Clinical Data: No additional findings.   Subjective: Chief Complaint  Patient presents with  . Right Knee - Pain    HPI 35 year old healthy male who is an avid weightlifter comes in with a one-month history of right knee pain from injury while squatting. He is not exactly sure when it happened due to the drill at the time. Now his knee feels like it wants to give out. It feels unstable. He still continues to have swelling of the knee. The pain does not radiate. He did have a duplex which was negative for DVT. He's been wearing a neoprene knee brace which does give him some support. Pain is worse with deep flexion of the knee. Denies any previous injuries or surgeries to the right knee.   Review of Systems Negative except for history of present illness  Objective: Vital Signs: There were no vitals taken for this visit.  Physical Exam  Constitutional: He is oriented to person, place, and time. He appears well-developed and well-nourished.  HENT:  Head: Normocephalic and atraumatic.  Eyes: EOM are normal.  Neck: Neck supple.  Cardiovascular: Intact distal pulses.   Pulmonary/Chest: Effort normal.  Abdominal: Soft.  Neurological: He is alert and oriented to person, place,  and time.  Skin: Skin is warm.  Psychiatric: He has a normal mood and affect. His behavior is normal. Judgment and thought content normal.  Nursing note and vitals reviewed.   Ortho Exam Exam  of the right knee shows a positive Lockman's and anterior drawer. He has no medial joint line tenderness negative McMurray's. He does have a small effusion. Collaterals and cruciates are stable otherwise. Specialty Comments:  No specialty comments available.  Imaging: Xr Knee 3 View Right  Result Date: 07/04/2016 No acute findings    PMFS History: There are no active problems to display for this patient.  Past Medical History:  Diagnosis Date  . Ankle sprain     No family history on file.  No past surgical history on file. Social History   Occupational History  . Not on file.   Social History Main Topics  . Smoking status: Former Smoker    Packs/day: 0.50    Types: Cigarettes  . Smokeless tobacco: Never Used  . Alcohol use Yes     Comment: occassional  . Drug use: No  . Sexual activity: Not on file

## 2016-07-15 ENCOUNTER — Inpatient Hospital Stay: Admission: RE | Admit: 2016-07-15 | Payer: Self-pay | Source: Ambulatory Visit

## 2016-07-18 ENCOUNTER — Encounter (INDEPENDENT_AMBULATORY_CARE_PROVIDER_SITE_OTHER): Payer: Self-pay

## 2016-07-18 ENCOUNTER — Ambulatory Visit (INDEPENDENT_AMBULATORY_CARE_PROVIDER_SITE_OTHER): Payer: Self-pay | Admitting: Orthopaedic Surgery

## 2016-07-23 ENCOUNTER — Ambulatory Visit
Admission: RE | Admit: 2016-07-23 | Discharge: 2016-07-23 | Disposition: A | Discharge status: Home / Self Care | Payer: No Typology Code available for payment source | Source: Ambulatory Visit | Attending: Orthopaedic Surgery | Admitting: Orthopaedic Surgery

## 2016-07-23 DIAGNOSIS — M25561 Pain in right knee: Secondary | ICD-10-CM

## 2016-07-25 ENCOUNTER — Encounter (INDEPENDENT_AMBULATORY_CARE_PROVIDER_SITE_OTHER): Payer: Self-pay | Admitting: Orthopaedic Surgery

## 2016-07-25 ENCOUNTER — Ambulatory Visit (INDEPENDENT_AMBULATORY_CARE_PROVIDER_SITE_OTHER): Payer: Self-pay | Admitting: Orthopaedic Surgery

## 2016-07-25 DIAGNOSIS — M958 Other specified acquired deformities of musculoskeletal system: Secondary | ICD-10-CM

## 2016-07-25 NOTE — Progress Notes (Signed)
   Office Visit Note   Patient: Seth DibbleJohnathan Ebrahim           Date of Birth: 09/09/1980           MRN: [id number] Visit Date: 07/25/2016              Requested by: No referring provider defined for this encounter. PCP: No PCP Per Patient   Assessment & Plan: Visit Diagnoses: No diagnosis found.  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  Cruciates and collaterals are intact. I recommend continue symptomatic treatment. Would expect this to heal on its own. I do not see any signs of instability of the OCD plus its inner nonweightbearing portion. I am going to put him in physical therapy to strengthen his knee back up. I think he can start to wean his brace as needed. He is allowed to resume squatting once he is asymptomatic. Follow-up with me as needed.  Follow-Up Instructions: Return if symptoms worsen or fail to improve.   Orders:  No orders of the defined types were placed in this encounter.  No orders of the defined types were placed in this encounter.     Procedures: No procedures performed   Clinical Data: No additional findings.   Subjective: Chief Complaint  Patient presents with  . Right Knee - Pain    Patient follows up today for his right knee MRI. Overall he is doing better. He is wearing a knee brace which does give him some good proprioceptive feedback. Denies any new symptoms or worsening of symptoms.    Review of Systems   Objective: Vital Signs: There were no vitals taken for this visit.  Physical Exam  Ortho Exam Exam is stable. Specialty Comments:  No specialty comments available.  Imaging: No results found.   PMFS History: There are no active problems to display for this patient.  Past Medical History:  Diagnosis Date  . Ankle sprain     No family history on file.  No past surgical history on file. Social History   Occupational History    . Not on file.   Social History Main Topics  . Smoking status: Former Smoker    Packs/day: 0.50    Types: Cigarettes  . Smokeless tobacco: Never Used  . Alcohol use Yes     Comment: occassional  . Drug use: No  . Sexual activity: Not on file

## 2017-10-17 ENCOUNTER — Encounter (HOSPITAL_COMMUNITY): Payer: Self-pay | Admitting: Emergency Medicine

## 2017-10-17 ENCOUNTER — Other Ambulatory Visit: Payer: Self-pay

## 2017-10-17 ENCOUNTER — Emergency Department (HOSPITAL_COMMUNITY)
Admission: EM | Admit: 2017-10-17 | Discharge: 2017-10-17 | Disposition: A | Discharge status: Home / Self Care | Payer: Self-pay | Attending: Emergency Medicine | Admitting: Emergency Medicine

## 2017-10-17 DIAGNOSIS — Z87891 Personal history of nicotine dependence: Secondary | ICD-10-CM | POA: Insufficient documentation

## 2017-10-17 DIAGNOSIS — K047 Periapical abscess without sinus: Secondary | ICD-10-CM | POA: Insufficient documentation

## 2017-10-17 MED ORDER — PENICILLIN V POTASSIUM 250 MG PO TABS
500.0000 mg | ORAL_TABLET | Freq: Once | ORAL | Status: AC
  Administered 2017-10-17: 500 mg via ORAL
  Filled 2017-10-17: qty 2

## 2017-10-17 MED ORDER — NAPROXEN 500 MG PO TABS: 500.0000 mg | ORAL_TABLET | Freq: Two times a day (BID) | ORAL | 0 refills | Status: DC

## 2017-10-17 MED ORDER — PENICILLIN V POTASSIUM 500 MG PO TABS: 500.0000 mg | ORAL_TABLET | Freq: Four times a day (QID) | ORAL | 0 refills | Status: DC

## 2017-10-17 NOTE — ED Provider Notes (Signed)
[facility] EMERGENCY DEPARTMENT Provider Note   CSN: [id number] Arrival date & time: 10/17/17  4098     History   Chief Complaint Chief Complaint  Patient presents with  . Dental Pain    HPI Seth Cunningham is a 37 y.o. male.   The history is provided by the patient and medical records.   Dental Pain      37 year old male here with right upper dental pain.  This began 2 days ago.  States now has right facial swelling.  No difficulty swallowing.  No fever or chills.  Not currently established with dentist.  No meds PTA.  Past Medical History:  Diagnosis Date  . Ankle sprain     Patient Active Problem List   Diagnosis Date Noted  . 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 No pertinent surgical history.     Home Medications    Prior to Admission medications   Medication Sig Start Date End Date Taking? Authorizing Provider  clindamycin (CLEOCIN) 300 MG capsule Take 1 capsule (300 mg total) by mouth 3 (three) times daily. Patient not taking: Reported on 07/25/2016 03/12/13   Linna Hoff, MD  diclofenac (CATAFLAM) 50 MG tablet Take 1 tablet (50 mg total) by mouth 3 (three) times daily. Patient not taking: Reported on 07/25/2016 03/12/13   Linna Hoff, MD  HYDROcodone-acetaminophen (NORCO/VICODIN) 5-325 MG tablet Take 2 tablets by mouth every 4 (four) hours as needed. Patient not taking: Reported on 07/25/2016 05/25/16   Lorre Nick, MD  ibuprofen (ADVIL,MOTRIN) 800 MG tablet Take 1 tablet (800 mg total) by mouth 3 (three) times daily. Patient not taking: Reported on 07/25/2016 11/25/15   Fayrene Helper, PA-C  methocarbamol (ROBAXIN) 500 MG tablet Take 1 tablet (500 mg total) by mouth 4 (four) times daily. Patient not taking: Reported on 07/25/2016 05/25/16   Lorre Nick, MD    Family History No family history on file.  Social History Social History   Tobacco Use  . Smoking status: Former Smoker    Packs/day:  0.50    Types: Cigarettes  . Smokeless tobacco: Never Used  Substance Use Topics  . Alcohol use: Yes    Comment: Socially   . Drug use: No     Allergies   Patient has no known allergies.   Review of Systems Review of Systems  HENT: Positive for dental problem.   All other systems reviewed and are negative.    Physical Exam Updated Vital Signs BP (!) 141/88 (BP Location: Right Arm)   Pulse 80   Temp 98.8 F (37.1 C) (Oral)   Resp 18   Ht 5\' 9"  (1.753 m)   Wt 97.5 kg (215 lb)   SpO2 100%   BMI 31.75 kg/m    Physical Exam  Constitutional: He is oriented to person, place, and time. He appears well-developed and well-nourished.  HENT:  Head: Normocephalic and atraumatic.  Mouth/Throat: Oropharynx is clear and moist.  Teeth largely in poor dentition, right upper pre-molars are broken and flush with gumline, surrounding gingiva appears irritated with small area of blood noted but no active bleeding; no drainable fluid collection, handling secretions appropriately, no trismus, swelling of right upper cheek without extension into neck, normal phonation without stridor  Eyes: Conjunctivae and EOM are normal. Pupils are equal, round, and reactive to light.  Neck: Normal range of motion.  Cardiovascular: Normal rate, regular rhythm and normal heart sounds.  Pulmonary/Chest: Effort normal and breath sounds  normal. No stridor. No respiratory distress.  Abdominal: Soft. Bowel sounds are normal.  Musculoskeletal: Normal range of motion.  Neurological: He is alert and oriented to person, place, and time.  Skin: Skin is warm and dry.  Psychiatric: He has a normal mood and affect.  Nursing note and vitals reviewed.    ED Treatments / Results  Labs (all labs ordered are listed, but only abnormal results are displayed) Labs Reviewed - No data to display  EKG  EKG Interpretation None       Radiology No results found.  Procedures Procedures (including critical care  time)  Medications Ordered in ED Medications  penicillin v potassium (VEETID) tablet 500 mg (not administered)     Initial Impression / Assessment and Plan / ED Course  I have reviewed the triage vital signs and the nursing notes.  Pertinent labs & imaging results that were available during my care of the patient were reviewed by me and considered in my medical decision making (see chart for details).  37 year old male here with right upper dental pain and mild right-sided cheek swelling.  Appears to have developing dental infection.  No drainable abscess on exam.  He is afebrile and nontoxic.  No swelling of the neck, handling secretions well.  Normal phonation without stridor.  No signs or symptoms concerning for Ludwig's angina.  Will start on antibiotics and referred to dentist for follow-up. Discussed plan with patient, he acknowledged understanding and agreed with plan of care.  Return precautions given for new or worsening symptoms.  Final Clinical Impressions(s) / ED Diagnoses   Final diagnoses:  Dental infection    ED Discharge Orders        Ordered    penicillin v potassium (VEETID) 500 MG tablet  4 times daily     10/17/17 0643    naproxen (NAPROSYN) 500 MG tablet  2 times daily with meals     10/17/17 0643      Garlon HatchetSanders, Maynor Mwangi M, PA-C 10/17/17 0645  Gilda CreasePollina, Christopher J, MD 10/19/17 2337

## 2017-10-17 NOTE — Discharge Instructions (Signed)
Take the prescribed medication as directed. Follow-up with dentist-- Dr. Mayford Knifeurner is on call today. Return to the ED for new or worsening symptoms.

## 2017-10-17 NOTE — ED Triage Notes (Signed)
Pt reports R upper dental pain X2 days. Facial swelling noted.

## 2017-12-15 ENCOUNTER — Encounter (HOSPITAL_COMMUNITY): Payer: Self-pay

## 2017-12-15 ENCOUNTER — Other Ambulatory Visit: Payer: Self-pay

## 2017-12-15 ENCOUNTER — Emergency Department (HOSPITAL_COMMUNITY)
Admission: EM | Admit: 2017-12-15 | Discharge: 2017-12-15 | Disposition: A | Discharge status: Home / Self Care | Payer: Self-pay | Attending: Emergency Medicine | Admitting: Emergency Medicine

## 2017-12-15 DIAGNOSIS — Z79899 Other long term (current) drug therapy: Secondary | ICD-10-CM | POA: Insufficient documentation

## 2017-12-15 DIAGNOSIS — Z87891 Personal history of nicotine dependence: Secondary | ICD-10-CM | POA: Insufficient documentation

## 2017-12-15 DIAGNOSIS — R369 Urethral discharge, unspecified: Secondary | ICD-10-CM | POA: Insufficient documentation

## 2017-12-15 HISTORY — DX: Gonococcal infection, unspecified: A54.9

## 2017-12-15 LAB — URINALYSIS, ROUTINE W REFLEX MICROSCOPIC
Bacteria, UA: NONE SEEN
Bilirubin Urine: NEGATIVE
Glucose, UA: NEGATIVE mg/dL
Hgb urine dipstick: NEGATIVE
Ketones, ur: NEGATIVE mg/dL
Nitrite: NEGATIVE
Protein, ur: NEGATIVE mg/dL
SPECIFIC GRAVITY, URINE: 1.02 (ref 1.005–1.030)
pH: 6 (ref 5.0–8.0)

## 2017-12-15 MED ORDER — LIDOCAINE HCL 1 % IJ SOLN
INTRAMUSCULAR | Status: AC
  Administered 2017-12-15: 1.5 mL
  Filled 2017-12-15: qty 20

## 2017-12-15 MED ORDER — AZITHROMYCIN 250 MG PO TABS
1000.0000 mg | ORAL_TABLET | Freq: Once | ORAL | Status: AC
  Administered 2017-12-15: 1000 mg via ORAL
  Filled 2017-12-15: qty 4

## 2017-12-15 MED ORDER — CEFTRIAXONE SODIUM 250 MG IJ SOLR
250.0000 mg | Freq: Once | INTRAMUSCULAR | Status: AC
  Administered 2017-12-15: 250 mg via INTRAMUSCULAR
  Filled 2017-12-15: qty 250

## 2017-12-15 NOTE — Discharge Instructions (Addendum)
You were treated for chlamydia and gonorrhea today.    As we discussed you will receive a phone call in the next 3 days if these results return positive.  If positive, you will need to inform all sexual partners and let them know that they need to be treated at the health department.  I have listed information to the health department below.  Please return to the emergency department if you have any new or concerning symptoms like fever greater than 100.4 F, abdominal pain, vomiting that does not stop.

## 2017-12-15 NOTE — ED Triage Notes (Signed)
PT C/O BURNING WITH URINATION AND PENILE DISCHARGE (YELLOW-GREEN) X 5-6 DAYS. DENIES ABDOMINAL PAIN OR FEVER.

## 2017-12-15 NOTE — ED Provider Notes (Signed)
[facility]-EMERGENCY DEPT Provider Note   CSN: [id number] Arrival date & time: 12/15/17  0855     History   Chief Complaint Chief Complaint  Patient presents with  . Penile Discharge    X 5-6 DAYS    HPI Seth Cunningham is a 37 y.o. male.  HPI   Patient is a 37 year old male with no significant past medical history who presents to the emergency department for evaluation of dysuria and penile discharge.  Patient reports that this developed about 5 days ago.  Reports penile discharge is yellow/green in color and is similar to what he had several years ago when he was diagnosed with gonorrhea.  Reports that he is sexually active with one male partner, denies regular condom use.  Denies fevers, chills, abdominal pain, nausea/vomiting, testicular pain/swelling, flank pain, painful bowel movements.  Past Medical History:  Diagnosis Date  . Ankle sprain   . Gonorrhea     Patient Active Problem List   Diagnosis Date Noted  . 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 No pertinent surgical history.      Home Medications    Prior to Admission medications   Medication Sig Start Date End Date Taking? Authorizing Provider  clindamycin (CLEOCIN) 300 MG capsule Take 1 capsule (300 mg total) by mouth 3 (three) times daily. Patient not taking: Reported on 07/25/2016 03/12/13   Linna Hoff, MD  diclofenac (CATAFLAM) 50 MG tablet Take 1 tablet (50 mg total) by mouth 3 (three) times daily. Patient not taking: Reported on 07/25/2016 03/12/13   Linna Hoff, MD  HYDROcodone-acetaminophen (NORCO/VICODIN) 5-325 MG tablet Take 2 tablets by mouth every 4 (four) hours as needed. Patient not taking: Reported on 07/25/2016 05/25/16   Lorre Nick, MD  ibuprofen (ADVIL,MOTRIN) 800 MG tablet Take 1 tablet (800 mg total) by mouth 3 (three) times daily. Patient not taking: Reported on 07/25/2016 11/25/15   Fayrene Helper, PA-C  methocarbamol  (ROBAXIN) 500 MG tablet Take 1 tablet (500 mg total) by mouth 4 (four) times daily. Patient not taking: Reported on 07/25/2016 05/25/16   Lorre Nick, MD  naproxen (NAPROSYN) 500 MG tablet Take 1 tablet (500 mg total) by mouth 2 (two) times daily with a meal. 10/17/17   Garlon Hatchet, PA-C  penicillin v potassium (VEETID) 500 MG tablet Take 1 tablet (500 mg total) by mouth 4 (four) times daily. 10/17/17   Garlon Hatchet, PA-C    Family History History reviewed. No pertinent family history.  Social History Social History   Tobacco Use  . Smoking status: Former Smoker    Packs/day: 0.50    Types: Cigarettes  . Smokeless tobacco: Never Used  Substance Use Topics  . Alcohol use: Yes    Comment: Socially   . Drug use: No     Allergies   Patient has no known allergies.   Review of Systems Review of Systems  Constitutional: Negative for chills and fever.  Gastrointestinal: Negative for abdominal pain, diarrhea, nausea and vomiting.  Genitourinary: Positive for discharge and dysuria. Negative for flank pain, genital sores, penile swelling, scrotal swelling and testicular pain.  Musculoskeletal: Negative for back pain.     Physical Exam Updated Vital Signs BP (!) 138/92 (BP Location: Right Arm)   Pulse 64   Temp 97.8 F (36.6 C) (Oral)   Resp 18   Ht  (1.753 m)   Wt 99.8 kg (220 lb)   SpO2 100%   BMI  32.49 kg/m   Physical Exam  Constitutional: He is oriented to person, place, and time. He appears well-developed and well-nourished. No distress.  HENT:  Head: Normocephalic and atraumatic.  Eyes: Right eye exhibits no discharge. Left eye exhibits no discharge.  Pulmonary/Chest: Effort normal. No respiratory distress.  Abdominal: Soft. Bowel sounds are normal. There is no tenderness.  Genitourinary:  Genitourinary Comments: Chaperone present for exam. White milky discharge noted from penis. No signs of lesion or erythema on the penis or testicles. The penis and  testicles are nontender. No testicular masses or swelling.  Neurological: He is alert and oriented to person, place, and time. Coordination normal.  Skin: Skin is warm and dry. He is not diaphoretic.  Psychiatric: He has a normal mood and affect. His behavior is normal.  Nursing note and vitals reviewed.    ED Treatments / Results  Labs (all labs ordered are listed, but only abnormal results are displayed) Labs Reviewed  URINALYSIS, ROUTINE W REFLEX MICROSCOPIC - Abnormal; Notable for the following components:      Result Value   APPearance HAZY (*)    Leukocytes, UA MODERATE (*)    WBC, UA >50 (*)    All other components within normal limits  GC/CHLAMYDIA PROBE AMP () NOT AT [facility]    EKG None  Radiology No results found.  Procedures Procedures (including critical care time)  Medications Ordered in ED Medications  azithromycin (ZITHROMAX) tablet 1,000 mg (1,000 mg Oral Given 12/15/17 1229)  cefTRIAXone (ROCEPHIN) injection 250 mg (250 mg Intramuscular Given 12/15/17 1229)  lidocaine (XYLOCAINE) 1 % (with pres) injection (1.5 mLs  Given 12/15/17 1229)     Initial Impression / Assessment and Plan / ED Course  I have reviewed the triage vital signs and the nursing notes.  Pertinent labs & imaging results that were available during my care of the patient were reviewed by me and considered in my medical decision making (see chart for details).     Patient is afebrile without abdominal tenderness, abdominal pain or painful bowel movements to indicate prostatitis.  No tenderness to palpation of the testes or epididymis to suggest orchitis or epididymitis.  STD cultures obtained including gonorrhea and chlamydia.  Patient refused HIV and syphilis testing, as he does not like needles.  UA reveals many white blood cells, this is presumably from gc/chlamydia given his age and risk factors.  Patient treated with ceftriaxone and azithromycin in the emergency department.   Counseled him that he has GC/chlamydia cultures pending and he will need to inform all sexual partners if results return positive.  Discussed importance of using protection when sexually active.  Counseled him on return precautions and he agrees and voiced understanding to the above plan and appears reliable for follow-up.   Final Clinical Impressions(s) / ED Diagnoses   Final diagnoses:  Penile discharge    ED Discharge Orders    None       Lawrence Marseilles 12/15/17 1637    Lorre Nick, MD 12/16/17 [phone]

## 2017-12-15 NOTE — ED Notes (Signed)
Bed: WTR7 Expected date:  Expected time:  Means of arrival:  Comments: 

## 2017-12-17 LAB — GC/CHLAMYDIA PROBE AMP (~~LOC~~) NOT AT ARMC
Chlamydia: NEGATIVE
Neisseria Gonorrhea: POSITIVE — AB

## 2018-12-15 ENCOUNTER — Encounter (HOSPITAL_COMMUNITY): Payer: Self-pay | Admitting: *Deleted

## 2018-12-15 ENCOUNTER — Emergency Department (HOSPITAL_COMMUNITY)
Admission: EM | Admit: 2018-12-15 | Discharge: 2018-12-15 | Disposition: A | Discharge status: Home / Self Care | Payer: Self-pay | Attending: Emergency Medicine | Admitting: Emergency Medicine

## 2018-12-15 DIAGNOSIS — Z87891 Personal history of nicotine dependence: Secondary | ICD-10-CM | POA: Insufficient documentation

## 2018-12-15 DIAGNOSIS — Z711 Person with feared health complaint in whom no diagnosis is made: Secondary | ICD-10-CM | POA: Insufficient documentation

## 2018-12-15 DIAGNOSIS — R369 Urethral discharge, unspecified: Secondary | ICD-10-CM

## 2018-12-15 DIAGNOSIS — Z79899 Other long term (current) drug therapy: Secondary | ICD-10-CM | POA: Insufficient documentation

## 2018-12-15 DIAGNOSIS — R300 Dysuria: Secondary | ICD-10-CM | POA: Insufficient documentation

## 2018-12-15 LAB — URINALYSIS, ROUTINE W REFLEX MICROSCOPIC
Bacteria, UA: NONE SEEN
Bilirubin Urine: NEGATIVE
Glucose, UA: NEGATIVE mg/dL
Hgb urine dipstick: NEGATIVE
Ketones, ur: NEGATIVE mg/dL
Nitrite: NEGATIVE
Protein, ur: NEGATIVE mg/dL
Specific Gravity, Urine: 1.029 (ref 1.005–1.030)
WBC, UA: >50 WBC/hpf — ABNORMAL HIGH (ref 0–5)
pH: 6 (ref 5.0–8.0)

## 2018-12-15 MED ORDER — METRONIDAZOLE 500 MG PO TABS
2000.0000 mg | ORAL_TABLET | Freq: Once | ORAL | Status: AC
  Administered 2018-12-15: 15:00:00 2000 mg via ORAL
  Filled 2018-12-15: qty 4

## 2018-12-15 MED ORDER — CEFTRIAXONE SODIUM 250 MG IJ SOLR
250.0000 mg | Freq: Once | INTRAMUSCULAR | Status: AC
  Administered 2018-12-15: 250 mg via INTRAMUSCULAR
  Filled 2018-12-15: qty 250

## 2018-12-15 MED ORDER — AZITHROMYCIN 250 MG PO TABS
1000.0000 mg | ORAL_TABLET | Freq: Once | ORAL | Status: AC
  Administered 2018-12-15: 15:00:00 1000 mg via ORAL
  Filled 2018-12-15: qty 4

## 2018-12-15 NOTE — ED Provider Notes (Signed)
[facility]-EMERGENCY DEPT Provider Note   CSN: [id number] Arrival date & time: 12/15/18  1312    History   Chief Complaint Chief Complaint  Patient presents with  . Penile Discharge  . Dysuria    HPI    Seth Cunningham is a 38 y.o. male with a PMHx of gonorrhea, who presents to the ED with complaints of concern for STD.  Patient states that he has had white penile discharge and some dysuria for the last 2 to 3 months, states that he got "tired of hurting when he pees" which is what prompted his ED visit today.  He believes that he has an STD.  He has not tried anything for it, no known aggravating factors.  He admits to being sexually active with 2 male partners in the last year, states that he uses protection with condoms each time.  He is presenting today requesting treatment for possible STD.  He declines wanting testing for HIV or syphilis, and declines wanting a GU exam.  He states that he just wants to "piss in a cup" and then be treated.  He denies having any fevers, chills, abdominal pain, nausea, vomiting, diarrhea, constipation, hematuria, malodorous urine, urinary frequency urgency, rectal pain, testicular pain or swelling, or any other complaints at this time.  The history is provided by the patient and medical records. No language interpreter was used.  Penile Discharge  Pertinent negatives include no abdominal pain.  Dysuria  Presenting symptoms: dysuria and penile discharge   Associated symptoms: no abdominal pain, no diarrhea, no fever, no hematuria, no nausea, no scrotal swelling, no urinary frequency and no vomiting     Past Medical History:  Diagnosis Date  . Ankle sprain   . Gonorrhea     Patient Active Problem List   Diagnosis Date Noted  . 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 No pertinent surgical history.      Home Medications    Prior to Admission medications   Medication Sig Start Date  End Date Taking? Authorizing Provider  clindamycin (CLEOCIN) 300 MG capsule Take 1 capsule (300 mg total) by mouth 3 (three) times daily. Patient not taking: Reported on 07/25/2016 03/12/13   Linna Hoff, MD  diclofenac (CATAFLAM) 50 MG tablet Take 1 tablet (50 mg total) by mouth 3 (three) times daily. Patient not taking: Reported on 07/25/2016 03/12/13   Linna Hoff, MD  HYDROcodone-acetaminophen (NORCO/VICODIN) 5-325 MG tablet Take 2 tablets by mouth every 4 (four) hours as needed. Patient not taking: Reported on 07/25/2016 05/25/16   Lorre Nick, MD  ibuprofen (ADVIL,MOTRIN) 800 MG tablet Take 1 tablet (800 mg total) by mouth 3 (three) times daily. Patient not taking: Reported on 07/25/2016 11/25/15   Fayrene Helper, PA-C  methocarbamol (ROBAXIN) 500 MG tablet Take 1 tablet (500 mg total) by mouth 4 (four) times daily. Patient not taking: Reported on 07/25/2016 05/25/16   Lorre Nick, MD  naproxen (NAPROSYN) 500 MG tablet Take 1 tablet (500 mg total) by mouth 2 (two) times daily with a meal. 10/17/17   Garlon Hatchet, PA-C  penicillin v potassium (VEETID) 500 MG tablet Take 1 tablet (500 mg total) by mouth 4 (four) times daily. 10/17/17   Garlon Hatchet, PA-C    Family History No family history on file.  Social History Social History   Tobacco Use  . Smoking status: Former Smoker    Packs/day: 0.50    Types: Cigarettes  .  Smokeless tobacco: Never Used  Substance Use Topics  . Alcohol use: Yes    Comment: Socially   . Drug use: No     Allergies   Patient has no known allergies.   Review of Systems Review of Systems  Constitutional: Negative for chills and fever.  Gastrointestinal: Negative for abdominal pain, constipation, diarrhea, nausea, rectal pain and vomiting.  Genitourinary: Positive for discharge and dysuria. Negative for frequency, hematuria, scrotal swelling, testicular pain and urgency.  Allergic/Immunologic: Negative for immunocompromised state.    Physical  Exam Updated Vital Signs BP (!) 143/91 (BP Location: Right Arm)   Pulse 84   Temp 98.5 F (36.9 C) (Oral)   Resp 18   SpO2 99%   Physical Exam Vitals signs and nursing note reviewed.  Constitutional:      General: He is not in acute distress.    Appearance: Normal appearance. He is well-developed. He is not toxic-appearing.     Comments: Afebrile, nontoxic, NAD  HENT:     Head: Normocephalic and atraumatic.  Eyes:     General:        Right eye: No discharge.        Left eye: No discharge.     Conjunctiva/sclera: Conjunctivae normal.  Neck:     Musculoskeletal: Normal range of motion and neck supple.  Cardiovascular:     Rate and Rhythm: Normal rate and regular rhythm.     Pulses: Normal pulses.     Heart sounds: Normal heart sounds, S1 normal and S2 normal. No murmur. No friction rub. No gallop.   Pulmonary:     Effort: Pulmonary effort is normal. No respiratory distress.     Breath sounds: Normal breath sounds. No decreased breath sounds, wheezing, rhonchi or rales.  Abdominal:     General: Bowel sounds are normal. There is no distension.     Palpations: Abdomen is soft. Abdomen is not rigid.     Tenderness: There is no abdominal tenderness. There is no right CVA tenderness, left CVA tenderness, guarding or rebound. Negative signs include Murphy's sign and McBurney's sign.     Comments: Soft, NTND, +BS throughout, no r/g/r, neg murphy's, neg mcburney's, no CVA TTP   Genitourinary:    Comments: Pt declined Musculoskeletal: Normal range of motion.  Skin:    General: Skin is warm and dry.     Findings: No rash.  Neurological:     Mental Status: He is alert and oriented to person, place, and time.     Sensory: Sensation is intact. No sensory deficit.     Motor: Motor function is intact.  Psychiatric:        Mood and Affect: Mood and affect normal.        Behavior: Behavior normal.      ED Treatments / Results  Labs (all labs ordered are listed, but only abnormal  results are displayed) Labs Reviewed  URINALYSIS, ROUTINE W REFLEX MICROSCOPIC - Abnormal; Notable for the following components:      Result Value   APPearance HAZY (*)    Leukocytes,Ua MODERATE (*)    WBC, UA >50 (*)    All other components within normal limits  URINE CULTURE  GC/CHLAMYDIA PROBE AMP ([place]) NOT AT [facility]    EKG None  Radiology No results found.  Procedures Procedures (including critical care time)  Medications Ordered in ED Medications  azithromycin (ZITHROMAX) tablet 1,000 mg (1,000 mg Oral Given 12/15/18 1452)    And  cefTRIAXone (ROCEPHIN) injection  250 mg (250 mg Intramuscular Given 12/15/18 1451)    And  metroNIDAZOLE (FLAGYL) tablet 2,000 mg (2,000 mg Oral Given 12/15/18 1452)     Initial Impression / Assessment and Plan / ED Course  I have reviewed the triage vital signs and the nursing notes.  Pertinent labs & imaging results that were available during my care of the patient were reviewed by me and considered in my medical decision making (see chart for details).        38 y.o. male here with penile discharge and dysuria for the last 2 to 3 months.  Concern for STD.  Patient declines wanting a genitourinary exam, declines wanting RPR or HIV testing, states that he just wants to "piss in a cup" and be treated. He has no abdominal tenderness on exam. Will get U/A, UCx, and GC/CT testing on urine, and empirically treat for GC/CT/trich. Doubt need for other labs. Will reassess after U/A results.   3:02 PM U/A with moderate leuks and >50 WBCs but no bacteria seen and mucus present. Doubt UTI, likely STD. Empirically treated. Discussed abstinence x14 days. F/up with health dept for future STD concerns. Safe sex encouraged, and discussed having partners tested and treated before re-engaging in intercourse after the 14 day abstinence period.  I explained the diagnosis and have given explicit precautions to return to the ER including for any other new or  worsening symptoms. The patient understands and accepts the medical plan as it's been dictated and I have answered their questions. Discharge instructions concerning home care and prescriptions have been given. The patient is STABLE and is discharged to home in good condition.    Final Clinical Impressions(s) / ED Diagnoses   Final diagnoses:  Concern about STD in male without diagnosis  Dysuria  Penile discharge    ED Discharge Orders    [address]None       Tylon Kemmerling, BoltonMercedes, [place] JerseyPA-C 12/15/18 1502    Terrilee FilesButler, Michael C, MD 12/16/18 1150

## 2018-12-15 NOTE — ED Notes (Signed)
Bed: WTR5 Expected date:  Expected time:  Means of arrival:  Comments: 

## 2018-12-15 NOTE — ED Triage Notes (Signed)
Pt complains of penile discharge and pain while urinating, is concerned he has STD.

## 2018-12-15 NOTE — Discharge Instructions (Addendum)
You have been treated for gonorrhea, chlamydia, and trichomonas in the ER but the hospital will call you if lab is positive. NO SEXUAL INTERCOURSE FOR AT LEAST 14 DAYS AFTER TODAY'S VISIT, THIS WILL INVALIDATE YOUR TREATMENT HERE. DO NOT ENGAGE IN SEXUAL ACTIVITY UNTIL YOU FIND OUT ABOUT YOUR RESULTS AND HAVE PARTNERS TESTED AND TREATED. ALL PARTNERS MUST BE TESTED AND TREATED FOR STD'S. ALWAYS USE CONDOMS WHEN ENGAGING IN INTERCOURSE. Follow up with [facility] STD clinic for future STD concerns or screenings.  Follow up with your regular doctor in 1 week for recheck of symptoms.

## 2018-12-16 LAB — URINE CULTURE: Culture: NO GROWTH

## 2019-05-13 ENCOUNTER — Other Ambulatory Visit: Payer: Self-pay

## 2019-05-13 ENCOUNTER — Encounter (HOSPITAL_COMMUNITY): Payer: Self-pay

## 2019-05-13 ENCOUNTER — Ambulatory Visit (HOSPITAL_COMMUNITY)
Admission: EM | Admit: 2019-05-13 | Discharge: 2019-05-13 | Disposition: A | Discharge status: Home / Self Care | Payer: Self-pay | Attending: Family Medicine | Admitting: Family Medicine

## 2019-05-13 DIAGNOSIS — K0889 Other specified disorders of teeth and supporting structures: Secondary | ICD-10-CM

## 2019-05-13 MED ORDER — IBUPROFEN 800 MG PO TABS: 800.0000 mg | ORAL_TABLET | Freq: Three times a day (TID) | ORAL | 0 refills | Status: AC

## 2019-05-13 MED ORDER — PENICILLIN V POTASSIUM 500 MG PO TABS: 500.0000 mg | ORAL_TABLET | Freq: Three times a day (TID) | ORAL | 0 refills | Status: DC

## 2019-05-13 NOTE — ED Provider Notes (Signed)
[facility] CARE CENTER   [id number] 05/13/19 Arrival Time: 1007  ASSESSMENT & PLAN:  1. Pain, dental     No sign of abscess requiring I&D at this time. Discussed.  Meds ordered this encounter  Medications  . penicillin v potassium (VEETID) 500 MG tablet    Sig: Take 1 tablet (500 mg total) by mouth 3 (three) times daily.    Dispense:  30 tablet    Refill:  0  . ibuprofen (ADVIL) 800 MG tablet    Sig: Take 1 tablet (800 mg total) by mouth 3 (three) times daily with meals.    Dispense:  21 tablet    Refill:  0    Dental resource written instructions given. He will schedule dental evaluation as soon as possible if not improving over the next 24-48 hours.  Reviewed expectations re: course of current medical issues. Questions answered. Outlined signs and symptoms indicating need for more acute intervention. Patient verbalized understanding. After Visit Summary given.   SUBJECTIVE:  Seth Cunningham is a 38 y.o. male who reports fairly abrupt onset of left upper rear dental pain described as aching/throbbing; mild hot/cold sensitivity. Present for 1-2 days. Fever: absent. Tolerating PO intake but reports pain with chewing. Normal swallowing. He does not see a dentist regularly. No neck swelling or pain. OTC analgesics without relief.  ROS: As per HPI.  OBJECTIVE: Vitals:   05/13/19 1034  BP: 135/83  Pulse: 78  Resp: 20  Temp: 98.1 F (36.7 C)  TempSrc: Oral  SpO2: 96%    General appearance: alert; no distress HENT: normocephalic; atraumatic; dentition: fair; left upper gums without areas of fluctuance, drainage, or bleeding and with tenderness to palpation over rear molars; normal jaw movement without difficulty Neck: supple without LAD; FROM; trachea midline Lungs: normal respirations; unlabored Skin: warm and dry Psychological: alert and cooperative; normal mood and affect  No Known Allergies  Past Medical History:  Diagnosis Date  . Ankle sprain   . Gonorrhea     Social History   Socioeconomic History  . Marital status: Single    Spouse name: Not on file  . Number of children: Not on file  . Years of education: Not on file  . Highest education level: Not on file  Occupational History  . Not on file  Social Needs  . Financial resource strain: Not on file  . Food insecurity    Worry: Not on file    Inability: Not on file  . Transportation needs    Medical: Not on file    Non-medical: Not on file  Tobacco Use  . Smoking status: Former Smoker    Packs/day: 0.50    Types: Cigarettes  . Smokeless tobacco: Never Used  Substance and Sexual Activity  . Alcohol use: Yes    Comment: Socially   . Drug use: No  . Sexual activity: Not on file  Lifestyle  . Physical activity    Days per week: Not on file    Minutes per session: Not on file  . Stress: Not on file  Relationships  . Social Musician on phone: Not on file    Gets together: Not on file    Attends religious service: Not on file    Active member of club or organization: Not on file    Attends meetings of clubs or organizations: Not on file    Relationship status: Not on file  . Intimate partner violence    Fear of current or  ex partner: Not on file    Emotionally abused: Not on file    Physically abused: Not on file    Forced sexual activity: Not on file  Other Topics Concern  . Not on file  Social History Narrative  . Not on file   Family History  Family history unknown: Yes   History reviewed. No pertinent surgical history.   Vanessa Kick, MD 05/13/19 1055

## 2019-05-13 NOTE — ED Triage Notes (Signed)
Pt presents with left side dental pain since yesterday.  

## 2019-06-20 ENCOUNTER — Other Ambulatory Visit: Payer: Self-pay

## 2019-06-20 ENCOUNTER — Emergency Department (HOSPITAL_COMMUNITY)
Admission: EM | Admit: 2019-06-20 | Discharge: 2019-06-20 | Disposition: A | Discharge status: Home / Self Care | Payer: Self-pay | Attending: Emergency Medicine | Admitting: Emergency Medicine

## 2019-06-20 ENCOUNTER — Encounter (HOSPITAL_COMMUNITY): Payer: Self-pay | Admitting: Emergency Medicine

## 2019-06-20 DIAGNOSIS — Z87891 Personal history of nicotine dependence: Secondary | ICD-10-CM | POA: Insufficient documentation

## 2019-06-20 DIAGNOSIS — J020 Streptococcal pharyngitis: Secondary | ICD-10-CM | POA: Insufficient documentation

## 2019-06-20 DIAGNOSIS — Z79899 Other long term (current) drug therapy: Secondary | ICD-10-CM | POA: Insufficient documentation

## 2019-06-20 DIAGNOSIS — Z113 Encounter for screening for infections with a predominantly sexual mode of transmission: Secondary | ICD-10-CM | POA: Insufficient documentation

## 2019-06-20 LAB — GROUP A STREP BY PCR: Group A Strep by PCR: DETECTED — AB

## 2019-06-20 MED ORDER — PENICILLIN G BENZATHINE 1200000 UNIT/2ML IM SUSP
1.2000 10*6.[IU] | Freq: Once | INTRAMUSCULAR | Status: AC
  Administered 2019-06-20: 1.2 10*6.[IU] via INTRAMUSCULAR
  Filled 2019-06-20: qty 2

## 2019-06-20 NOTE — Discharge Instructions (Signed)
It was my pleasure taking care of you today!   You have been treated for strep throat today.  Discard your toothbrush and begin using a new one in 3 days. For sore throat, take ibuprofen every 6 hours as needed. Follow up with your doctor in 2-3 days if no improvement. Return to the ED sooner for worsening condition, inability to swallow, breathing difficulty, new concerns.

## 2019-06-20 NOTE — ED Triage Notes (Signed)
Pt c/o sore throat with white patches in back of throat x 2 days.

## 2019-06-20 NOTE — ED Provider Notes (Signed)
[facility] DEPT Provider Note   CSN: [id number] Arrival date & time: 06/20/19  4431     History   Chief Complaint Chief Complaint  Patient presents with  . Sore Throat    HPI Seth Cunningham is a 38 y.o. male.     The history is provided by the patient and medical records. No language interpreter was used.  Sore Throat Pertinent negatives include no chest pain, no abdominal pain and no shortness of breath.   Seth Cunningham is a 38 y.o. male  with a PMH as listed below who presents to the Emergency Department complaining of sore throat for the last 2 days.  Has tried TheraFlu which helps, but wears off quickly.  Denies any known sick contacts.  Has had some pain with swallowing and noticed his tonsils today pain emergency department visit.  Denies any fever or chills.  No cough or congestion.  Patient does endorse oral sex weekend and concerned his be STD related.  No abdominal pain, nausea, vomiting, dysuria or penile discharge.   Past Medical History:  Diagnosis Date  . Ankle sprain   . Gonorrhea     Patient Active Problem List   Diagnosis Date Noted  . 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 No pertinent surgical history.      Home Medications    Prior to Admission medications   Medication Sig Start Date End Date Taking? Authorizing Provider  ibuprofen (ADVIL) 800 MG tablet Take 1 tablet (800 mg total) by mouth 3 (three) times daily with meals. 05/13/19   Vanessa Kick, MD  penicillin v potassium (VEETID) 500 MG tablet Take 1 tablet (500 mg total) by mouth 3 (three) times daily. 05/13/19   Vanessa Kick, MD    Family History Family History  Family history unknown: Yes    Social History Social History   Tobacco Use  . Smoking status: Former Smoker    Packs/day: 0.50    Types: Cigarettes  . Smokeless tobacco: Never Used  Substance Use Topics  . Alcohol use: Yes    Comment: Socially   .  Drug use: No     Allergies   Patient has no known allergies.   Review of Systems Review of Systems  Constitutional: Negative for chills and fever.  HENT: Positive for sore throat. Negative for congestion and voice change.   Respiratory: Negative for cough and shortness of breath.   Cardiovascular: Negative for chest pain.  Gastrointestinal: Negative for abdominal pain, nausea and vomiting.  Genitourinary: Negative for difficulty urinating, discharge, dysuria, penile pain, penile swelling, scrotal swelling and testicular pain.  Musculoskeletal: Negative for back pain.     Physical Exam Updated Vital Signs BP 122/62 (BP Location: Right Arm)   Pulse 75   Temp 98.4 F (36.9 C) (Oral)   Resp 18   SpO2 95%   Physical Exam Vitals signs and nursing note reviewed.  Constitutional:      General: He is not in acute distress.    Appearance: He is well-developed.  HENT:     Head: Normocephalic and atraumatic.     Mouth/Throat:     Comments: Oropharynx with erythema tonsillar hypertrophy as well as exudates.  No peritonsillar abscess. Uvula midline.  Tolerating secretions fine.  Normal phonation. Neck:     Musculoskeletal: Neck supple.  Cardiovascular:     Rate and Rhythm: Normal rate and regular rhythm.     Heart sounds: Normal heart sounds. No murmur.  Pulmonary:     Effort: Pulmonary effort is normal. No respiratory distress.     Breath sounds: Normal breath sounds. No wheezing or rales.  Musculoskeletal: Normal range of motion.  Skin:    General: Skin is warm and dry.  Neurological:     Mental Status: He is alert.      ED Treatments / Results  Labs (all labs ordered are listed, but only abnormal results are displayed) Labs Reviewed  GROUP A STREP BY PCR - Abnormal; Notable for the following components:      Result Value   Group A Strep by PCR DETECTED (*)    All other components within normal limits  GC/CHLAMYDIA PROBE AMP ([place]) NOT AT [facility]    EKG None   Radiology No results found.  Procedures Procedures (including critical care time)  Medications Ordered in ED Medications  penicillin g benzathine (BICILLIN LA) 1200000 UNIT/2ML injection 1.2 Million Units (1.2 Million Units Intramuscular Given 06/20/19 1132)     Initial Impression / Assessment and Plan / ED Course  I have reviewed the triage vital signs and the nursing notes.  Pertinent labs & imaging results that were available during my care of the patient were reviewed by me and considered in my medical decision making (see chart for details).       Seth Cunningham is a 38 y.o. male who presents to ED for sore throat for the last 2 days.  Afebrile, hemodynamically stable.  Oropharynx with erythema and bilateral tonsillar hypertrophy and exudates.  Uvula midline.  No PTA.  Endorses recent oral sex and concern for possibility of STD as etiology of pharyngitis.  Gonorrhea swab was obtained.  His strep PCR was positive, therefore gonococcal pharyngitis very unlikely.  Treated with Bicillin.  Understands home care instructions and return precautions.  PCP follow-up encouraged.  All questions answered.  Final Clinical Impressions(s) / ED Diagnoses   Final diagnoses:  Strep throat    ED Discharge Orders    None       , Chase Picket, PA-C 06/20/19 1152    Margarita Grizzle, MD 06/21/19 1601

## 2019-06-23 LAB — GC/CHLAMYDIA PROBE AMP (~~LOC~~) NOT AT ARMC
Chlamydia: NEGATIVE
Neisseria Gonorrhea: NEGATIVE

## 2019-10-15 ENCOUNTER — Other Ambulatory Visit: Payer: Self-pay

## 2019-10-15 ENCOUNTER — Ambulatory Visit (HOSPITAL_COMMUNITY)
Admission: EM | Admit: 2019-10-15 | Discharge: 2019-10-15 | Disposition: A | Discharge status: Home / Self Care | Payer: Self-pay | Attending: Family Medicine | Admitting: Family Medicine

## 2019-10-15 ENCOUNTER — Encounter (HOSPITAL_COMMUNITY): Payer: Self-pay

## 2019-10-15 DIAGNOSIS — Z202 Contact with and (suspected) exposure to infections with a predominantly sexual mode of transmission: Secondary | ICD-10-CM

## 2019-10-15 MED ORDER — DOXYCYCLINE HYCLATE 100 MG PO CAPS: 100.0000 mg | ORAL_CAPSULE | Freq: Two times a day (BID) | ORAL | 0 refills | Status: DC

## 2019-10-15 MED ORDER — CEFTRIAXONE SODIUM 500 MG IJ SOLR
500.0000 mg | Freq: Once | INTRAMUSCULAR | Status: AC
  Administered 2019-10-15: 12:00:00 500 mg via INTRAMUSCULAR

## 2019-10-15 MED ORDER — CEFTRIAXONE SODIUM 500 MG IJ SOLR
INTRAMUSCULAR | Status: AC
  Filled 2019-10-15: qty 500

## 2019-10-15 MED ORDER — LIDOCAINE HCL (PF) 1 % IJ SOLN
INTRAMUSCULAR | Status: AC
  Filled 2019-10-15: qty 2

## 2019-10-15 NOTE — ED Provider Notes (Signed)
[facility] CARE CENTER   [id number] 10/15/19 Arrival Time: 1121  ASSESSMENT & PLAN:  1. Possible exposure to STD     Declines urethral cytology.   Discharge Instructions     You have been given the following today for treatment of suspected gonorrhea and/or chlamydia:  cefTRIAXone (ROCEPHIN) injection 500 mg  Please pick up your prescription for doxycycline 100 mg and begin taking twice daily for the next seven (7) days.   Please refrain from all sexual activity for at least the next seven days.     Reviewed expectations re: course of current medical issues. Questions answered. Outlined signs and symptoms indicating need for more acute intervention. Patient verbalized understanding. After Visit Summary given.   SUBJECTIVE:  Seth Cunningham is a 39 y.o. male who reports possible exposure to STD. Reports having sexual intercourse with male who told him today she tested positive for gonorrhea and chlamydia. He reports no symptoms including penile discharge. Afebrile. No abdominal or pelvic pain. No n/v. No rashes or lesions. Chart review shows h/o treated gonorrhea 1 year ago.    OBJECTIVE:  Vitals:   10/15/19 1135  BP: 139/78  Pulse: 79  Resp: 18  Temp: 98.2 F (36.8 C)  TempSrc: Oral  SpO2: 97%  Weight: 110.8 kg     General appearance: alert, cooperative, appears stated age and no distress Lungs: unlabored respirations Back: no CVA tenderness; FROM at waist Abdomen: soft, non-tender GU: deferred Skin: warm and dry Psychological: alert and cooperative; normal mood and affect.    Labs Reviewed - No data to display  No Known Allergies  Past Medical History:  Diagnosis Date  . Ankle sprain   . Gonorrhea    Family History  Family history unknown: Yes   Social History   Socioeconomic History  . Marital status: Single    Spouse name: Not on file  . Number of children: Not on file  . Years of education: Not on file  . Highest education  level: Not on file  Occupational History  . Not on file  Tobacco Use  . Smoking status: Former Smoker    Packs/day: 0.50    Types: Cigarettes  . Smokeless tobacco: Never Used  Substance and Sexual Activity  . Alcohol use: Yes    Comment: Socially   . Drug use: No  . Sexual activity: Yes    Birth control/protection: None  Other Topics Concern  . Not on file  Social History Narrative  . Not on file   Social Determinants of Health   Financial Resource Strain:   . Difficulty of Paying Living Expenses: Not on file  Food Insecurity:   . Worried About Programme researcher, broadcasting/film/video in the Last Year: Not on file  . Ran Out of Food in the Last Year: Not on file  Transportation Needs:   . Lack of Transportation (Medical): Not on file  . Lack of Transportation (Non-Medical): Not on file  Physical Activity:   . Days of Exercise per Week: Not on file  . Minutes of Exercise per Session: Not on file  Stress:   . Feeling of Stress : Not on file  Social Connections:   . Frequency of Communication with Friends and Family: Not on file  . Frequency of Social Gatherings with Friends and Family: Not on file  . Attends Religious Services: Not on file  . Active Member of Clubs or Organizations: Not on file  . Attends Banker Meetings: Not on file  .  Marital Status: Not on file  Intimate Partner Violence:   . Fear of Current or Ex-Partner: Not on file  . Emotionally Abused: Not on file  . Physically Abused: Not on file  . Sexually Abused: Not on file          Vanessa Kick, MD 10/15/19 1242

## 2019-10-15 NOTE — Discharge Instructions (Signed)
You have been given the following today for treatment of suspected gonorrhea and/or chlamydia:  cefTRIAXone (ROCEPHIN) injection 500 mg  Please pick up your prescription for doxycycline 100 mg and begin taking twice daily for the next seven (7) days.  Please refrain from all sexual activity for at least the next seven days.  

## 2019-10-15 NOTE — ED Triage Notes (Signed)
Pt is here for STD testing, states his partner has [place]. He had unprotected sex with her on Friday. He is denying ALL symptoms.

## 2020-07-06 ENCOUNTER — Encounter (HOSPITAL_COMMUNITY): Payer: Self-pay

## 2020-07-06 ENCOUNTER — Other Ambulatory Visit: Payer: Self-pay

## 2020-07-06 ENCOUNTER — Emergency Department (HOSPITAL_COMMUNITY)
Admission: EM | Admit: 2020-07-06 | Discharge: 2020-07-07 | Disposition: A | Discharge status: Home / Self Care | Payer: Self-pay | Attending: Emergency Medicine | Admitting: Emergency Medicine

## 2020-07-06 DIAGNOSIS — H9209 Otalgia, unspecified ear: Secondary | ICD-10-CM | POA: Insufficient documentation

## 2020-07-06 DIAGNOSIS — R590 Localized enlarged lymph nodes: Secondary | ICD-10-CM | POA: Insufficient documentation

## 2020-07-06 DIAGNOSIS — J029 Acute pharyngitis, unspecified: Secondary | ICD-10-CM | POA: Insufficient documentation

## 2020-07-06 DIAGNOSIS — Z87891 Personal history of nicotine dependence: Secondary | ICD-10-CM | POA: Insufficient documentation

## 2020-07-06 LAB — GROUP A STREP BY PCR: Group A Strep by PCR: NOT DETECTED

## 2020-07-06 LAB — MONONUCLEOSIS SCREEN: Mono Screen: NEGATIVE

## 2020-07-06 NOTE — ED Provider Notes (Signed)
Patient care signed out by Dr. Bernette Mayers pending test results for strep and mono in evaluation of afebrile sore throat. History of strep.   Tests negative. Instructions for sore throat care and return precautions to peritonsillar abscess discussed.    Elpidio Anis, PA-C 07/06/20 2359    Dione Booze, MD 07/07/20 Glynis Smiles

## 2020-07-06 NOTE — ED Provider Notes (Signed)
[facility] EMERGENCY DEPARTMENT Provider Note  CSN: [id number] Arrival date & time: 07/06/20 2242    History Chief Complaint  Patient presents with  . Sore Throat    HPI  Seth Cunningham is a 39 y.o. male with no significant PMH reports onset of sore throat yesterday, worse on the right, worse with swallowing. Associated with ear pain and chills, but no measured fever. No other cough, congestion, N/V. No known sick contacts. Denies history of strep but was diagnosed with strep about a year ago.    Past Medical History:  Diagnosis Date  . Ankle sprain   . Gonorrhea     History reviewed. No pertinent surgical history.  Family History  Family history unknown: Yes    Social History   Tobacco Use  . Smoking status: Former Smoker    Packs/day: 0.50    Types: Cigarettes  . Smokeless tobacco: Never Used  Vaping Use  . Vaping Use: Never used  Substance Use Topics  . Alcohol use: Yes    Comment: Socially   . Drug use: No     Home Medications Prior to Admission medications   Medication Sig Start Date End Date Taking? Authorizing Provider  doxycycline (VIBRAMYCIN) 100 MG capsule Take 1 capsule (100 mg total) by mouth 2 (two) times daily. 10/15/19   Mardella Layman, MD  ibuprofen (ADVIL) 800 MG tablet Take 1 tablet (800 mg total) by mouth 3 (three) times daily with meals. 05/13/19   Mardella Layman, MD  penicillin v potassium (VEETID) 500 MG tablet Take 1 tablet (500 mg total) by mouth 3 (three) times daily. 05/13/19   Mardella Layman, MD     Allergies    Patient has no known allergies.   Review of Systems   Review of Systems A comprehensive review of systems was completed and negative except as noted in HPI.    Physical Exam BP 130/89 (BP Location: Right Arm)   Pulse 65   Temp 98.2 F (36.8 C) (Oral)   Resp 16   Ht 5\' 9"  (1.753 m)   Wt 97.5 kg   SpO2 99%   BMI 31.75 kg/m   Physical Exam Vitals and nursing note reviewed.  HENT:     Head: Normocephalic.       Right Ear: Tympanic membrane normal.     Left Ear: Tympanic membrane normal.     Nose: Nose normal.     Mouth/Throat:     Mouth: Mucous membranes are moist.     Pharynx: Posterior oropharyngeal erythema present. No oropharyngeal exudate.     Tonsils: No tonsillar exudate. 2+ on the right. 1+ on the left.  Eyes:     Extraocular Movements: Extraocular movements intact.     Conjunctiva/sclera: Conjunctivae normal.  Pulmonary:     Effort: Pulmonary effort is normal.  Musculoskeletal:        General: Normal range of motion.     Cervical back: Neck supple.  Lymphadenopathy:     Cervical: Cervical adenopathy present.  Skin:    Findings: No rash (on exposed skin).  Neurological:     Mental Status: He is alert and oriented to person, place, and time.  Psychiatric:        Mood and Affect: Mood normal.      ED Results / Procedures / Treatments   Labs (all labs ordered are listed, but only abnormal results are displayed) Labs Reviewed  GROUP A STREP BY PCR  MONONUCLEOSIS SCREEN    EKG None  Radiology No results found.  Procedures Procedures  Medications Ordered in the ED Medications - No data to display   MDM Rules/Calculators/A&P MDM Will check for strep and mono given sore throat without exudate and cervical lymphadenopathy.  ED Course  I have reviewed the triage vital signs and the nursing notes.  Pertinent labs & imaging results that were available during my care of the patient were reviewed by me and considered in my medical decision making (see chart for details).  Clinical Course as of Jul 06 2322  Tue Jul 06, 2020  2321 Care of the patient signed out to Seth Cunningham, [facility] at the change of shift pending Strep/Mono tests.    [CS]    Clinical Course User Index [CS] Pollyann Savoy, MD    Final Clinical Impression(s) / ED Diagnoses Final diagnoses:  None    Rx / DC Orders ED Discharge Orders    None       Pollyann Savoy, MD 07/06/20  2323

## 2020-07-06 NOTE — ED Triage Notes (Signed)
Pt complains of a sore throat since yesterday

## 2020-07-06 NOTE — Discharge Instructions (Addendum)
Your tests for strep and mono are negative. Follow the instructions for sore throat care provided. Tylenol and/or ibuprofen for pain relief.

## 2020-07-07 ENCOUNTER — Encounter (HOSPITAL_COMMUNITY): Payer: Self-pay

## 2020-07-07 ENCOUNTER — Other Ambulatory Visit: Payer: Self-pay

## 2020-07-07 ENCOUNTER — Emergency Department (HOSPITAL_COMMUNITY)
Admission: EM | Admit: 2020-07-07 | Discharge: 2020-07-07 | Disposition: A | Discharge status: Home / Self Care | Payer: Self-pay | Attending: Emergency Medicine | Admitting: Emergency Medicine

## 2020-07-07 ENCOUNTER — Emergency Department (HOSPITAL_COMMUNITY): Payer: Self-pay

## 2020-07-07 DIAGNOSIS — J029 Acute pharyngitis, unspecified: Secondary | ICD-10-CM | POA: Insufficient documentation

## 2020-07-07 DIAGNOSIS — Z87891 Personal history of nicotine dependence: Secondary | ICD-10-CM | POA: Insufficient documentation

## 2020-07-07 LAB — BASIC METABOLIC PANEL
Anion gap: 10 (ref 5–15)
BUN: 17 mg/dL (ref 6–20)
CO2: 26 mmol/L (ref 22–32)
Calcium: 9.3 mg/dL (ref 8.9–10.3)
Chloride: 104 mmol/L (ref 98–111)
Creatinine, Ser: 1.26 mg/dL — ABNORMAL HIGH (ref 0.61–1.24)
GFR, Estimated: >60 mL/min (ref >60)
Glucose, Bld: 101 mg/dL — ABNORMAL HIGH (ref 70–99)
Potassium: 4.3 mmol/L (ref 3.5–5.1)
Sodium: 140 mmol/L (ref 135–145)

## 2020-07-07 LAB — CBC WITH DIFFERENTIAL/PLATELET
Abs Immature Granulocytes: 0.02 10*3/uL (ref 0.00–0.07)
Basophils Absolute: 0 10*3/uL (ref 0.0–0.1)
Basophils Relative: 0 %
Eosinophils Absolute: 0.1 10*3/uL (ref 0.0–0.5)
Eosinophils Relative: 1 %
HCT: 42.5 % (ref 39.0–52.0)
Hemoglobin: 13.8 g/dL (ref 13.0–17.0)
Immature Granulocytes: 0 %
Lymphocytes Relative: 23 %
Lymphs Abs: 1.7 10*3/uL (ref 0.7–4.0)
MCH: 31.3 pg (ref 26.0–34.0)
MCHC: 32.5 g/dL (ref 30.0–36.0)
MCV: 96.4 fL (ref 80.0–100.0)
Monocytes Absolute: 0.7 10*3/uL (ref 0.1–1.0)
Monocytes Relative: 9 %
Neutro Abs: 5 10*3/uL (ref 1.7–7.7)
Neutrophils Relative %: 67 %
Platelets: 205 10*3/uL (ref 150–400)
RBC: 4.41 MIL/uL (ref 4.22–5.81)
RDW: 12.6 % (ref 11.5–15.5)
WBC: 7.5 10*3/uL (ref 4.0–10.5)
nRBC: 0 % (ref 0.0–0.2)

## 2020-07-07 MED ORDER — FENTANYL CITRATE (PF) 100 MCG/2ML IJ SOLN
50.0000 ug | Freq: Once | INTRAMUSCULAR | Status: AC
  Administered 2020-07-07: 50 ug via INTRAVENOUS
  Filled 2020-07-07: qty 2

## 2020-07-07 MED ORDER — DEXAMETHASONE SODIUM PHOSPHATE 10 MG/ML IJ SOLN
10.0000 mg | Freq: Once | INTRAMUSCULAR | Status: AC
  Administered 2020-07-07: 10 mg via INTRAVENOUS
  Filled 2020-07-07: qty 1

## 2020-07-07 MED ORDER — SODIUM CHLORIDE (PF) 0.9 % IJ SOLN
INTRAMUSCULAR | Status: AC
  Filled 2020-07-07: qty 50

## 2020-07-07 MED ORDER — IOHEXOL 300 MG/ML  SOLN
75.0000 mL | Freq: Once | INTRAMUSCULAR | Status: AC | PRN
  Administered 2020-07-07: 75 mL via INTRAVENOUS

## 2020-07-07 NOTE — Discharge Instructions (Signed)
You were seen today for pharyngitis, want you to continue take ibuprofen as directed on the bottle.  If this does not feel like it is resolving in the next 2 days please come back to the emergency department.  If you have any worsening trouble swallowing or feel like you have are having trouble breathing please come back to the ER.  Please use the attached instructions.  The Decadron will start to kick in and help you with this for the next 2 days.  Continue to salt gargle as well.  Please come back to the emergency department for any new worsening concerning symptoms.

## 2020-07-07 NOTE — ED Provider Notes (Signed)
[facility]-EMERGENCY DEPT Provider Note   CSN: [id number] Arrival date & time: 07/07/20  1036     History Chief Complaint  Patient presents with  . Sore Throat    Seth DibbleJohnathan Seth Cunningham is a 39 y.o. male with no pertinent past medical history that presents to the emergency department today for sore throat for 2 days.  Patient was seen yesterday for the same, was told to come back if it worsened.  At that time had a mono and group A strep which was negative.  Previous provider noted 2+ enlargement on right side of tonsil without exudate.  Patient states that when he went home last night, followed instructions of ibuprofen and Tylenol however pain got worse and now he feels as if he cannot swallow with increased secretions in his mouth.  States that he has been spitting up saliva, no drooling.  States that it is difficult to swallow and has progressively worsened through the night.  States that he also feels as if his voice has changed, does not  describe a muffled sounding voice.  Admits to some hot sweats and chills, has not taken his temperature.  Today was 99.6, did take ibuprofen before he came here.  States that he has been able to drink fluids okay.  Has not taken any antibiotics.  Denies any other URI symptoms such as cough, congestion, nausea, vomiting, myalgias, headache.  Does state that the sore throat is now in his neck as well.  No other complaints.  HPI     Past Medical History:  Diagnosis Date  . Ankle sprain   . Gonorrhea     Patient Active Problem List   Diagnosis Date Noted  . 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 No pertinent surgical history.     Family History  Family history unknown: Yes    Social History   Tobacco Use  . Smoking status: Former Smoker    Packs/day: 0.50    Types: Cigarettes  . Smokeless tobacco: Never Used  Vaping Use  . Vaping Use: Never used  Substance Use Topics  . Alcohol use:  Yes    Comment: Socially   . Drug use: No    Home Medications Prior to Admission medications   Medication Sig Start Date End Date Taking? Authorizing Provider  doxycycline (VIBRAMYCIN) 100 MG capsule Take 1 capsule (100 mg total) by mouth 2 (two) times daily. 10/15/19   Mardella LaymanHagler, Brian, MD  ibuprofen (ADVIL) 800 MG tablet Take 1 tablet (800 mg total) by mouth 3 (three) times daily with meals. 05/13/19   Mardella LaymanHagler, Brian, MD  penicillin v potassium (VEETID) 500 MG tablet Take 1 tablet (500 mg total) by mouth 3 (three) times daily. 05/13/19   Mardella LaymanHagler, Brian, MD    Allergies    Patient has no known allergies.  Review of Systems   Review of Systems  Constitutional: Negative for chills, diaphoresis, fatigue and fever.  HENT: Positive for sore throat, trouble swallowing and voice change. Negative for congestion, drooling, ear discharge, ear pain, facial swelling, hearing loss, mouth sores, rhinorrhea, sinus pressure and sinus pain.   Eyes: Negative for pain and visual disturbance.  Respiratory: Negative for cough, shortness of breath and wheezing.   Cardiovascular: Negative for chest pain, palpitations and leg swelling.  Gastrointestinal: Negative for abdominal distention, abdominal pain, diarrhea, nausea and vomiting.  Genitourinary: Negative for difficulty urinating.  Musculoskeletal: Negative for back pain, neck pain and neck stiffness.  Skin: Negative  for pallor.  Neurological: Negative for dizziness, speech difficulty, weakness and headaches.  Psychiatric/Behavioral: Negative for confusion.    Physical Exam Updated Vital Signs BP (!) 144/90 (BP Location: Right Arm)   Pulse 65   Temp 99.6 F (37.6 C) (Oral)   Resp 16   SpO2 100%   Physical Exam Constitutional:      General: He is not in acute distress.    Appearance: Normal appearance. He is not ill-appearing, toxic-appearing or diaphoretic.     Comments: Patient does not appear toxic looking, is handling secretions.  No trismus.    HENT:     Head: Normocephalic and atraumatic.     Jaw: There is normal jaw occlusion. No trismus, swelling or malocclusion.     Nose: No congestion or rhinorrhea.     Right Sinus: No maxillary sinus tenderness or frontal sinus tenderness.     Left Sinus: No maxillary sinus tenderness or frontal sinus tenderness.     Mouth/Throat:     Mouth: Mucous membranes are moist. No oral lesions.     Dentition: Normal dentition.     Tongue: No lesions.     Palate: No mass and lesions.     Pharynx: Oropharynx is clear. Uvula midline. No pharyngeal swelling, oropharyngeal exudate, posterior oropharyngeal erythema or uvula swelling.     Tonsils: No tonsillar exudate or tonsillar abscesses. 3+ on the right. 1+ on the left.     Comments: Patient with 3+ tonsil on the right side without exudate, erythema noted.  Pharynx with some fullness towards the right side with tenderness to palpation of tonsillar area.  No swelling under the tongue, uvula is midline without any inflammation. Eyes:     General: No visual field deficit.       Right eye: No discharge.        Left eye: No discharge.     Extraocular Movements: Extraocular movements intact.     Conjunctiva/sclera: Conjunctivae normal.     Pupils: Pupils are equal, round, and reactive to light.  Cardiovascular:     Rate and Rhythm: Normal rate and regular rhythm.     Pulses: Normal pulses.     Heart sounds: Normal heart sounds. No murmur heard.  No friction rub. No gallop.   Pulmonary:     Effort: Pulmonary effort is normal. No respiratory distress.     Breath sounds: Normal breath sounds. No stridor. No wheezing, rhonchi or rales.  Chest:     Chest wall: No tenderness.  Abdominal:     General: Abdomen is flat. Bowel sounds are normal. There is no distension.     Palpations: Abdomen is soft.     Tenderness: There is no abdominal tenderness. There is no right CVA tenderness or left CVA tenderness.  Musculoskeletal:        General: No swelling or  tenderness. Normal range of motion.     Cervical back: Normal range of motion. No rigidity or tenderness.     Right lower leg: No edema.     Left lower leg: No edema.  Lymphadenopathy:     Cervical: No cervical adenopathy.  Skin:    General: Skin is warm and dry.     Capillary Refill: Capillary refill takes less than 2 seconds.     Findings: No erythema or rash.  Neurological:     General: No focal deficit present.     Mental Status: He is alert and oriented to person, place, and time.     Cranial  Nerves: Cranial nerves are intact. No cranial nerve deficit or facial asymmetry.     Motor: Motor function is intact. No weakness.     Coordination: Coordination is intact.     Gait: Gait is intact. Gait normal.  Psychiatric:        Mood and Affect: Mood normal.     ED Results / Procedures / Treatments   Labs (all labs ordered are listed, but only abnormal results are displayed) Labs Reviewed  BASIC METABOLIC PANEL - Abnormal; Notable for the following components:      Result Value   Glucose, Bld 101 (*)    Creatinine, Ser 1.26 (*)    All other components within normal limits  CBC WITH DIFFERENTIAL/PLATELET    EKG None  Radiology CT Soft Tissue Neck W Contrast  Result Date: 07/07/2020 CLINICAL DATA:  Rule out epiglottitis or tonsillitis. Left peritonsillar abscess. EXAM: CT NECK WITH CONTRAST TECHNIQUE: Multidetector CT imaging of the neck was performed using the standard protocol following the bolus administration of intravenous contrast. CONTRAST:  55mL OMNIPAQUE IOHEXOL 300 MG/ML  SOLN COMPARISON:  None. FINDINGS: Pharynx and larynx: Enlargement of the tonsils bilaterally right greater than left. No focal fluid collection or abscess identified in the tonsils. Airway intact. Epiglottis and larynx normal. Salivary glands: No inflammation, mass, or stone. Thyroid: Negative Lymph nodes: No enlarged or necrotic lymph nodes in the neck. Vascular: Normal vascular enhancement. Limited  intracranial: Negative Visualized orbits: Negative Mastoids and visualized paranasal sinuses: Mild mucosal edema right maxillary sinus otherwise clear sinuses. Skeleton: No acute skeletal abnormality. Upper chest: Lung apices clear bilaterally. Other: None IMPRESSION: Asymmetric enlargement right tonsil without abscess. This likely is related to infection or inflammation given history. Follow-up until clearing recommended to exclude underlying mass lesion. No enlarged lymph node in the neck. Electronically Signed   By: Marlan Palau M.D.   On: 07/07/2020 13:40    Procedures Procedures (including critical care time)  Medications Ordered in ED Medications  sodium chloride (PF) 0.9 % injection (has no administration in time range)  dexamethasone (DECADRON) injection 10 mg (10 mg Intravenous Given 07/07/20 1210)  fentaNYL (SUBLIMAZE) injection 50 mcg (50 mcg Intravenous Given 07/07/20 1210)  iohexol (OMNIPAQUE) 300 MG/ML solution 75 mL (75 mLs Intravenous Contrast Given 07/07/20 1316)    ED Course  I have reviewed the triage vital signs and the nursing notes.  Pertinent labs & imaging results that were available during my care of the patient were reviewed by me and considered in my medical decision making (see chart for details).    MDM Rules/Calculators/A&P                          Jarmal Zingg is a 39 y.o. male with no pertinent past medical history that presents to the emergency department today for sore throat.  Patient presents for the second time in 24 hours for increased worsening sore throat.  Will obtain CT imaging at this time to rule out peritonsillar abscess, Decadron and fentanyl given.  CT without abscess.  Unremarkable CBC and BMP.  Patient to be discharged at this time.  Did not think need antibiotics for 2 days of sore throat with negative strep, this is most likely viral.  Did discuss this with patient, did discuss that if symptoms do not resolve within 2 days to come  back here for antibiotics/steroids.  Patient expressed understanding, strict return precautions given.  Patient be discharged at this time.  Doubt need for further emergent work up at this time. I explained the diagnosis and have given explicit precautions to return to the ER including for any other new or worsening symptoms. The patient understands and accepts the medical plan as it's been dictated and I have answered their questions. Discharge instructions concerning home care and prescriptions have been given. The patient is STABLE and is discharged to home in good condition.    Final Clinical Impression(s) / ED Diagnoses Final diagnoses:  Acute pharyngitis, unspecified etiology    Rx / DC Orders ED Discharge Orders    None       Farrel Gordon, PA-C 07/07/20 1449    Benjiman Core, MD 07/07/20 1511

## 2020-07-07 NOTE — ED Triage Notes (Signed)
Pt reports sore throat over the past few days. Pt reports he was here last night and was told to come back if the pain and swelling became worse. Pt reports its painful when swallowing.

## 2020-07-08 ENCOUNTER — Emergency Department (HOSPITAL_COMMUNITY)
Admission: EM | Admit: 2020-07-08 | Discharge: 2020-07-09 | Disposition: A | Discharge status: Home / Self Care | Payer: Self-pay | Attending: Emergency Medicine | Admitting: Emergency Medicine

## 2020-07-08 ENCOUNTER — Encounter (HOSPITAL_COMMUNITY): Payer: Self-pay

## 2020-07-08 ENCOUNTER — Other Ambulatory Visit: Payer: Self-pay

## 2020-07-08 DIAGNOSIS — Z87891 Personal history of nicotine dependence: Secondary | ICD-10-CM | POA: Insufficient documentation

## 2020-07-08 DIAGNOSIS — J039 Acute tonsillitis, unspecified: Secondary | ICD-10-CM | POA: Insufficient documentation

## 2020-07-08 NOTE — ED Provider Notes (Signed)
[facility]-EMERGENCY DEPT Provider Note   CSN: [id number] Arrival date & time: 07/08/20  2307     History Chief Complaint  Patient presents with  . Sore Throat    Seth Cunningham is a 39 y.o. male with a history of former tobacco use who returns to the emergency department with complaints of continued worsening sore throat over the past 3 days. Pain is constant, right-sided, worse with swallowing but is able to swallow. No significant alleviating factors since leaving the emergency department. Has had associated subjective fevers. Denies nasal congestion, ear pain, cough, neck stiffness, or rash.   HPI     Past Medical History:  Diagnosis Date  . Ankle sprain   . Gonorrhea     Patient Active Problem List   Diagnosis Date Noted  . 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 No pertinent surgical history.     Family History  Family history unknown: Yes    Social History   Tobacco Use  . Smoking status: Former Smoker    Packs/day: 0.50    Types: Cigarettes  . Smokeless tobacco: Never Used  Vaping Use  . Vaping Use: Never used  Substance Use Topics  . Alcohol use: Yes    Comment: Socially   . Drug use: No    Home Medications Prior to Admission medications   Medication Sig Start Date End Date Taking? Authorizing Provider  doxycycline (VIBRAMYCIN) 100 MG capsule Take 1 capsule (100 mg total) by mouth 2 (two) times daily. 10/15/19   Mardella Layman, MD  ibuprofen (ADVIL) 800 MG tablet Take 1 tablet (800 mg total) by mouth 3 (three) times daily with meals. 05/13/19   Mardella Layman, MD  penicillin v potassium (VEETID) 500 MG tablet Take 1 tablet (500 mg total) by mouth 3 (three) times daily. 05/13/19   Mardella Layman, MD    Allergies    Patient has no known allergies.  Review of Systems   Review of Systems  Constitutional: Positive for fever. Negative for chills.  HENT: Positive for sore throat and trouble  swallowing (Painful but able.). Negative for congestion and ear pain.   Respiratory: Negative for cough.   Cardiovascular: Negative for chest pain.  Gastrointestinal: Negative for abdominal pain.  Neurological: Negative for syncope.  All other systems reviewed and are negative.  Physical Exam Updated Vital Signs BP (!) 173/82 (BP Location: Right Arm)   Pulse 80   Temp 98.5 F (36.9 C) (Oral) Comment: Has been taking ibprofen  Resp 18   Ht 5\' 9"  (1.753 m)   Wt 97.5 kg   SpO2 95%   BMI 31.75 kg/m   Physical Exam Vitals and nursing note reviewed.  Constitutional:      General: He is not in acute distress.    Appearance: He is well-developed. He is not toxic-appearing.  HENT:     Head: Normocephalic and atraumatic.     Right Ear: Ear canal normal. Tympanic membrane is not perforated, erythematous, retracted or bulging.     Left Ear: Ear canal normal. Tympanic membrane is not perforated, erythematous, retracted or bulging.     Ears:     Comments: No mastoid erythema/swellng/tenderness.     Nose:     Right Sinus: No maxillary sinus tenderness or frontal sinus tenderness.     Left Sinus: No maxillary sinus tenderness or frontal sinus tenderness.     Mouth/Throat:     Pharynx: Oropharynx is clear. Uvula midline. Posterior oropharyngeal erythema  present. No oropharyngeal exudate.     Tonsils: 3+ on the right. 1+ on the left.     Comments: Patient has tonsillar swelling that is asymmetric right greater than left.. No obvious peritonsillar abscess visible on exam. Patient tolerating own secretions without difficulty. No trismus. No drooling. No hot potato voice. No swelling beneath the tongue, submandibular compartment is soft.  Eyes:     General:        Right eye: No discharge.        Left eye: No discharge.     Conjunctiva/sclera: Conjunctivae normal.  Cardiovascular:     Rate and Rhythm: Normal rate and regular rhythm.  Pulmonary:     Effort: Pulmonary effort is normal. No  respiratory distress.     Breath sounds: Normal breath sounds. No wheezing, rhonchi or rales.  Abdominal:     General: There is no distension.     Palpations: Abdomen is soft.     Tenderness: There is no abdominal tenderness.  Musculoskeletal:     Cervical back: Neck supple. No edema, erythema, rigidity or crepitus. No pain with movement.  Lymphadenopathy:     Cervical: No cervical adenopathy.  Skin:    General: Skin is warm and dry.     Findings: No rash.  Neurological:     Mental Status: He is alert.  Psychiatric:        Behavior: Behavior normal.     ED Results / Procedures / Treatments   Labs (all labs ordered are listed, but only abnormal results are displayed) Labs Reviewed  RESP PANEL BY RT-PCR (FLU A&B, COVID) [id number]    EKG None  Radiology CT Soft Tissue Neck W Contrast  Result Date: 07/07/2020 CLINICAL DATA:  Rule out epiglottitis or tonsillitis. Left peritonsillar abscess. EXAM: CT NECK WITH CONTRAST TECHNIQUE: Multidetector CT imaging of the neck was performed using the standard protocol following the bolus administration of intravenous contrast. CONTRAST:  21mL OMNIPAQUE IOHEXOL 300 MG/ML  SOLN COMPARISON:  None. FINDINGS: Pharynx and larynx: Enlargement of the tonsils bilaterally right greater than left. No focal fluid collection or abscess identified in the tonsils. Airway intact. Epiglottis and larynx normal. Salivary glands: No inflammation, mass, or stone. Thyroid: Negative Lymph nodes: No enlarged or necrotic lymph nodes in the neck. Vascular: Normal vascular enhancement. Limited intracranial: Negative Visualized orbits: Negative Mastoids and visualized paranasal sinuses: Mild mucosal edema right maxillary sinus otherwise clear sinuses. Skeleton: No acute skeletal abnormality. Upper chest: Lung apices clear bilaterally. Other: None IMPRESSION: Asymmetric enlargement right tonsil without abscess. This likely is related to infection or inflammation given history.  Follow-up until clearing recommended to exclude underlying mass lesion. No enlarged lymph node in the neck. Electronically Signed   By: Marlan Palau M.D.   On: 07/07/2020 13:40    Procedures Procedures (including critical care time)  Medications Ordered in ED Medications  amoxicillin-clavulanate (AUGMENTIN) 875-125 MG per tablet 1 tablet (has no administration in time range)  naproxen (NAPROSYN) tablet 500 mg (has no administration in time range)  lidocaine (XYLOCAINE) 2 % viscous mouth solution 15 mL (has no administration in time range)    ED Course  I have reviewed the triage vital signs and the nursing notes.  Pertinent labs & imaging results that were available during my care of the patient were reviewed by me and considered in my medical decision making (see chart for details).    MDM Rules/Calculators/A&P  Patient presents to the ED with complaints of continued and worsening sore throat for the past 3 days. He is nontoxic, resting comfortably, his blood pressure is elevated, doubt hypertensive emergency, vitals are otherwise within normal limits. On exam he does have posterior oropharyngeal erythema with right tonsillar swelling that is asymmetric. He is tolerating his own secretions without difficulty. No trismus. Airway is patent.  Additional history obtained:  Additional history obtained from chart review nursing note reviewed. Per chart review patient had has had ED visits 11/16 & 11/17-has had negative strep and mono test, had a CT soft tissue neck with contrast that showed Asymmetric enlargement right tonsil without abscess. This likely is related to infection or inflammation given history. Follow-up until clearing recommended to exclude underlying mass lesion. No enlarged lymph node in the neck.  Patient had a CT soft tissue scan of the neck that did not show findings of abscess at that time- Given patient physical exam appears similar today in  comparison to documented exam yesterday and he is tolerating secretions without difficulty with a patent airway do not feel that repeat CT imaging is necessary at this time. Exam not consistent w/ ludwigs angina. Will give viscous lidocaine and naproxen to help with discomfort. Will start patient on antibiotics to cover for possible bacterial cause. Will provide ENT follow-up as well. I discussed results, treatment plan, need for follow-up, and return precautions with the patient. Provided opportunity for questions, patient confirmed understanding and is in agreement with plan.   Portions of this note were generated with Scientist, clinical (histocompatibility and immunogenetics). Dictation errors may occur despite best attempts at proofreading.  Final Clinical Impression(s) / ED Diagnoses Final diagnoses:  Tonsillitis    Rx / DC Orders ED Discharge Orders         Ordered    amoxicillin-clavulanate (AUGMENTIN) 875-125 MG tablet  Every 12 hours        07/09/20 0020    lidocaine (XYLOCAINE) 2 % solution  Every 4 hours PRN        07/09/20 0020    naproxen (NAPROSYN) 500 MG tablet  2 times daily PRN        07/09/20 0020           Cherly Anderson, PA-C 07/09/20 0021    Palumbo, April, MD 07/09/20 3532

## 2020-07-08 NOTE — ED Triage Notes (Signed)
Pt reports sore throat for several days that keeps getting worse. Pt seen twice this week. Dx with acute pharyngitis.

## 2020-07-09 MED ORDER — ALUM & MAG HYDROXIDE-SIMETH 200-200-20 MG/5ML PO SUSP
30.0000 mL | Freq: Once | ORAL | Status: DC
  Filled 2020-07-09: qty 30

## 2020-07-09 MED ORDER — LIDOCAINE VISCOUS HCL 2 % MT SOLN
15.0000 mL | Freq: Once | OROMUCOSAL | Status: AC
  Administered 2020-07-09: 15 mL via OROMUCOSAL
  Filled 2020-07-09: qty 15

## 2020-07-09 MED ORDER — NAPROXEN 500 MG PO TABS
500.0000 mg | ORAL_TABLET | Freq: Once | ORAL | Status: AC
  Administered 2020-07-09: 500 mg via ORAL
  Filled 2020-07-09: qty 1

## 2020-07-09 MED ORDER — AMOXICILLIN-POT CLAVULANATE 875-125 MG PO TABS: 1.0000 | ORAL_TABLET | Freq: Two times a day (BID) | ORAL | 0 refills | Status: DC

## 2020-07-09 MED ORDER — LIDOCAINE VISCOUS HCL 2 % MT SOLN: 15.0000 mL | OROMUCOSAL | 0 refills | Status: AC | PRN

## 2020-07-09 MED ORDER — NAPROXEN 500 MG PO TABS: 500.0000 mg | ORAL_TABLET | Freq: Two times a day (BID) | ORAL | 0 refills | Status: AC | PRN

## 2020-07-09 MED ORDER — AMOXICILLIN-POT CLAVULANATE 875-125 MG PO TABS
1.0000 | ORAL_TABLET | Freq: Once | ORAL | Status: AC
  Administered 2020-07-09: 1 via ORAL
  Filled 2020-07-09: qty 1

## 2020-07-09 NOTE — Discharge Instructions (Addendum)
You were seen in the emergency department today for continued and worsening pain to your throat. We are sending you with the following medications:  - Augmentin-is an antibiotic to cover for infection, please take twice per day.  Please take all of your antibiotics until finished. You may develop abdominal discomfort or diarrhea from the antibiotic.  You may help offset this with probiotics which you can buy at the store (ask your pharmacist if unable to find) or get probiotics in the form of eating yogurt. Do not eat or take the probiotics until 2 hours after your antibiotic.   - Naproxen is a nonsteroidal anti-inflammatory medication that will help with pain and swelling. Be sure to take this medication as prescribed with food, 1 pill every 12 hours,  It should be taken with food, as it can cause stomach upset, and more seriously, stomach bleeding. Do not take other nonsteroidal anti-inflammatory medications with this such as Advil, Motrin, Aleve, Mobic, Goodie Powder, or Motrin.    - Viscous lidocine-please take once every 4 hours as needed for pain to help numb/soothe the throat.  You make take Tylenol per over the counter dosing with these medications.   We have prescribed you new medication(s) today. Discuss the medications prescribed today with your pharmacist as they can have adverse effects and interactions with your other medicines including over the counter and prescribed medications. Seek medical evaluation if you start to experience new or abnormal symptoms after taking one of these medicines, seek care immediately if you start to experience difficulty breathing, feeling of your throat closing, facial swelling, or rash as these could be indications of a more serious allergic reaction  Your CT scan from yesterday showed swelling/inflammation, we are treating you for an infectious process, you will need to possibly have a repeat CT scan to ensure that there is no underlying abnormalities of  the tonsils. Please discuss with your primary care provider. Your blood pressure was also noted to be elevated in the emergency department, please have this rechecked by your primary care provider as well.  Please follow-up with primary care or the ear nose and throat group provided in your discharge instructions. Return to the emergency department for new or worsening symptoms including but not limited to worsening pain, inability to swallow, inability to keep fluids down, increased swelling, neck pain/stiffness, or any other concerns.

## 2021-02-18 ENCOUNTER — Encounter (HOSPITAL_COMMUNITY): Payer: Self-pay

## 2021-02-18 ENCOUNTER — Emergency Department (HOSPITAL_COMMUNITY)
Admission: EM | Admit: 2021-02-18 | Discharge: 2021-02-18 | Disposition: A | Discharge status: Home / Self Care | Payer: Federal, State, Local not specified - PPO | Attending: Emergency Medicine | Admitting: Emergency Medicine

## 2021-02-18 DIAGNOSIS — Z87891 Personal history of nicotine dependence: Secondary | ICD-10-CM | POA: Insufficient documentation

## 2021-02-18 DIAGNOSIS — K047 Periapical abscess without sinus: Secondary | ICD-10-CM | POA: Diagnosis not present

## 2021-02-18 DIAGNOSIS — K0889 Other specified disorders of teeth and supporting structures: Secondary | ICD-10-CM | POA: Diagnosis present

## 2021-02-18 MED ORDER — AMOXICILLIN-POT CLAVULANATE 875-125 MG PO TABS: 1.0000 | ORAL_TABLET | Freq: Two times a day (BID) | ORAL | 0 refills | Status: AC

## 2021-02-18 NOTE — ED Provider Notes (Signed)
[facility]-EMERGENCY DEPT Provider Note   CSN: [id number] Arrival date & time: 02/18/21  0813     History Chief Complaint  Patient presents with   Dental Problem    Seth Cunningham is a 40 y.o. male.  HPI Patient is a 40 year old male with past medical history without any significant chronic ongoing medical problems.  Patient states that for the past 2 days he has had left upper dental pain.  States he has a known cavity and eroded tooth appear.  He states that he is planning on following up with dentist soon but does not arrange for this yet.  He states that the pain is achy constant severe 8/10 denies any aggravating mitigating factors apart from worse with touch and chewing.  Denies any bleeding.  Denies any fevers or chills.  Denies any difficulty opening or closing his mouth states that he is chewing on the other side because of the discomfort.  No fevers or chills.  No tongue pain or swelling.  No hoarse voice or drooling.  Denies any other associate symptoms.  No aggravating mitigate factors apart from those discussed above.     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 No pertinent surgical history.     Family History  Family history unknown: Yes    Social History   Tobacco Use   Smoking status: Former    Packs/day: 0.50    Pack years: 0.00    Types: Cigarettes   Smokeless tobacco: Never  Vaping Use   Vaping Use: Never used  Substance Use Topics   Alcohol use: Yes    Comment: Socially    Drug use: No    Home Medications Prior to Admission medications   Medication Sig Start Date End Date Taking? Authorizing Provider  amoxicillin-clavulanate (AUGMENTIN) 875-125 MG tablet Take 1 tablet by mouth every 12 (twelve) hours for 10 days. 02/18/21 02/28/21 Yes Taiwo Fish S, PA   ibuprofen (ADVIL) 800 MG tablet Take 1 tablet (800 mg total) by mouth 3 (three) times daily with meals. Patient taking differently: Take 800 mg by mouth every 8 (eight) hours as needed for moderate pain.  05/13/19   Mardella Layman, MD  lidocaine (XYLOCAINE) 2 % solution Use as directed 15 mLs in the mouth or throat every 4 (four) hours as needed for mouth pain. 07/09/20   Petrucelli, Samantha R, PA-C  naproxen (NAPROSYN) 500 MG tablet Take 1 tablet (500 mg total) by mouth 2 (two) times daily as needed for moderate pain. 07/09/20   Petrucelli, Pleas Koch, PA-C    Allergies    Patient has no known allergies.  Review of Systems   Review of Systems  Constitutional:  Negative for fever.  HENT:  Positive for dental problem. Negative for congestion.   Respiratory:  Negative for shortness of breath.   Cardiovascular:  Negative for chest pain.  Gastrointestinal:  Negative for abdominal distention.  Neurological:  Negative for dizziness and headaches.   Physical Exam Updated Vital Signs BP (!) 141/99 (BP Location: Left Arm)   Pulse 80   Temp 97.9 F (36.6 C) (Oral)   Resp 15   SpO2 100%   Physical Exam Vitals and nursing note reviewed.  Constitutional:      General: He is not in acute distress.  Appearance: Normal appearance. He is not ill-appearing.  HENT:     Head: Normocephalic and atraumatic.     Mouth/Throat:     Comments: No trismus.  Forage motion tongue.  No sublingual tenderness.  Multiple fully eroded teeth.  Poor dentition throughout.  There is left upper molar that is fully eroded and there is some swelling erythema, no fluctuance. Eyes:     General: No scleral icterus.       Right eye: No discharge.        Left eye: No discharge.     Conjunctiva/sclera: Conjunctivae normal.  Pulmonary:     Effort: Pulmonary effort is normal.     Breath sounds: No stridor.  Neurological:     Mental Status: He is alert and oriented to person, place, and time. Mental status is at  baseline.    ED Results / Procedures / Treatments   Labs (all labs ordered are listed, but only abnormal results are displayed) Labs Reviewed - No data to display  EKG None  Radiology No results found.  Procedures Procedures   Medications Ordered in ED Medications - No data to display  ED Course  I have reviewed the triage vital signs and the nursing notes.  Pertinent labs & imaging results that were available during my care of the patient were reviewed by me and considered in my medical decision making (see chart for details).    MDM Rules/Calculators/A&P                          Patient is well-appearing 40 year old male with left upper dental pain exam concerning for dental infection but no notable fluctuant abscess that could be drained.  No trismus doubt Ludewig's angina overall well-appearing with no vital signs are normal apart from hypertension.  Plan for pain control as Tylenol ibuprofen and lidocaine  Patient will follow-up with dentistry.   Final Clinical Impression(s) / ED Diagnoses Final diagnoses:  Dental abscess    Rx / DC Orders ED Discharge Orders          Ordered    amoxicillin-clavulanate (AUGMENTIN) 875-125 MG tablet  Every 12 hours        02/18/21 0922             Gailen Shelter, PA 02/18/21 1035    Pricilla Loveless, MD 02/19/21 1539

## 2021-02-18 NOTE — Discharge Instructions (Addendum)
Your dentistPlease use cool compresses of the left side of your face, take Tylenol, ibuprofen and drink plenty of water.  Please follow-up with please take antibiotics for the entire course.

## 2021-02-18 NOTE — ED Triage Notes (Addendum)
Pt arrived via POV, c/o what he believes is an abscess on left side of mouth. Pain started yesterday.

## 2021-06-20 ENCOUNTER — Ambulatory Visit (HOSPITAL_COMMUNITY)
Admission: EM | Admit: 2021-06-20 | Discharge: 2021-06-20 | Disposition: A | Discharge status: Home / Self Care | Payer: Federal, State, Local not specified - PPO | Attending: Emergency Medicine | Admitting: Emergency Medicine

## 2021-06-20 ENCOUNTER — Encounter (HOSPITAL_COMMUNITY): Payer: Self-pay

## 2021-06-20 ENCOUNTER — Other Ambulatory Visit: Payer: Self-pay

## 2021-06-20 DIAGNOSIS — M79652 Pain in left thigh: Secondary | ICD-10-CM

## 2021-06-20 MED ORDER — PREDNISONE 20 MG PO TABS: 40.0000 mg | ORAL_TABLET | Freq: Every day | ORAL | 0 refills | Status: AC

## 2021-06-20 MED ORDER — KETOROLAC TROMETHAMINE 30 MG/ML IJ SOLN
30.0000 mg | Freq: Once | INTRAMUSCULAR | Status: AC
  Administered 2021-06-20: 30 mg via INTRAMUSCULAR

## 2021-06-20 MED ORDER — CYCLOBENZAPRINE HCL 10 MG PO TABS: 10.0000 mg | ORAL_TABLET | Freq: Every day | ORAL | 0 refills | Status: AC

## 2021-06-20 MED ORDER — KETOROLAC TROMETHAMINE 30 MG/ML IJ SOLN
INTRAMUSCULAR | Status: AC
  Filled 2021-06-20: qty 1

## 2021-06-20 NOTE — ED Triage Notes (Signed)
Pt presents with left leg pain X 2 days.  

## 2021-06-20 NOTE — ED Provider Notes (Signed)
[facility]    CSN: [id number] Arrival date & time: 06/20/21  1142      History   Chief Complaint Chief Complaint  Patient presents with   Leg Pain    HPI Seth Cunningham is a 40 y.o. male.   Patient presents with left lateral upper leg numbness and pain for 2 days.  Do not extend past knee or hip.  Endorses that they began after doing ab exercises where he was hanging and lifting legs up.  Range of motion is still intact.  No prior injury or trauma.  Has not attempted treatment.   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 No pertinent surgical history.     Home Medications    Prior to Admission medications   Medication Sig Start Date End Date Taking? Authorizing Provider  cyclobenzaprine (FLEXERIL) 10 MG tablet Take 1 tablet (10 mg total) by mouth at bedtime. 06/20/21  Yes Hadasa Gasner R, NP  predniSONE (DELTASONE) 20 MG tablet Take 2 tablets (40 mg total) by mouth daily. 06/20/21  Yes Aison Malveaux R, NP  ibuprofen (ADVIL) 800 MG tablet Take 1 tablet (800 mg total) by mouth 3 (three) times daily with meals. Patient taking differently: Take 800 mg by mouth every 8 (eight) hours as needed for moderate pain.  05/13/19   Mardella Layman, MD  lidocaine (XYLOCAINE) 2 % solution Use as directed 15 mLs in the mouth or throat every 4 (four) hours as needed for mouth pain. 07/09/20   Petrucelli, Samantha R, PA-C  naproxen (NAPROSYN) 500 MG tablet Take 1 tablet (500 mg total) by mouth 2 (two) times daily as needed for moderate pain. 07/09/20   Petrucelli, Pleas Koch, PA-C    Family History Family History  Family history unknown: Yes    Social History Social History   Tobacco Use   Smoking status: Former    Packs/day: 0.50    Types: Cigarettes   Smokeless tobacco: Never  Vaping Use   Vaping Use:  Never used  Substance Use Topics   Alcohol use: Yes    Comment: Socially    Drug use: No     Allergies   Patient has no known allergies.   Review of Systems Review of Systems  Constitutional: Negative.   Respiratory: Negative.    Cardiovascular: Negative.   Skin: Negative.   Neurological: Negative.     Physical Exam Triage Vital Signs ED Triage Vitals  Enc Vitals Group     BP 06/20/21 1400 131/66     Pulse Rate 06/20/21 1400 65     Resp 06/20/21 1400 20     Temp 06/20/21 1400 98.2 F (36.8 C)     Temp Source 06/20/21 1400 Oral     SpO2 06/20/21 1400 98 %     Weight --      Height --      Head Circumference --      Peak Flow --      Pain Score 06/20/21 1404 7     Pain Loc --      Pain Edu? --      Excl. in GC? --    No data found.  Updated Vital Signs BP 131/66 (BP Location: Right Arm)   Pulse 65   Temp 98.2 F (36.8 C) (Oral)  Resp 20   SpO2 98%   Visual Acuity Right Eye Distance:   Left Eye Distance:   Bilateral Distance:    Right Eye Near:   Left Eye Near:    Bilateral Near:     Physical Exam Constitutional:      Appearance: Normal appearance. He is normal weight.  HENT:     Head: Normocephalic.  Eyes:     Extraocular Movements: Extraocular movements intact.  Pulmonary:     Effort: Pulmonary effort is normal.  Musculoskeletal:       Legs:     Comments: Unable to reproduce tenderness, no swelling, deformity or edema noted range of motion of the leg is intact, range of motion of left hip is intact  Skin:    General: Skin is warm and dry.  Neurological:     Mental Status: He is alert and oriented to person, place, and time. Mental status is at baseline.  Psychiatric:        Mood and Affect: Mood normal.        Behavior: Behavior normal.     UC Treatments / Results  Labs (all labs ordered are listed, but only abnormal results are displayed) Labs Reviewed - No data to display  EKG   Radiology No results  found.  Procedures Procedures (including critical care time)  Medications Ordered in UC Medications  ketorolac (TORADOL) 30 MG/ML injection 30 mg (30 mg Intramuscular Given 06/20/21 1505)    Initial Impression / Assessment and Plan / UC Course  I have reviewed the triage vital signs and the nursing notes.  Pertinent labs & imaging results that were available during my care of the patient were reviewed by me and considered in my medical decision making (see chart for details).  Acute pain of left thigh  Will defer imaging at this time, discussed with patient, in agreement with plan of care, orthopedic follow-up for persistent or reoccurring pain, resources given  1.  Prednisone 40 mg daily with food 2.  Flexeril 10 mg at bedtime as needed 3.  Toradol 30 mg IM now 4.  Advise heat over the affected area, daily stretching and pillows for support   Final Clinical Impressions(s) / UC Diagnoses   Final diagnoses:  Acute pain of left thigh     Discharge Instructions      Your pain is most likely caused by irritation to the muscles or ligaments.   Starting tomorrow begin prednisone every morning with food for 5 days  Can sue flexeril at bedtime for additional comfort, be mindful this medication may make you drowsy   You may use heating pad in 15 minute intervals as needed for additional comfort, within the first 2-3 days you may find comfort in using ice in 10-15 minutes over affected area  Begin stretching affected area daily for 10 minutes as tolerated to further loosen muscles   When lying down place pillow underneath and between knees for support  If pain persist after recommended treatment or reoccurs if may be beneficial to follow up with orthopedic specialist for evaluation, this doctor specializes in the bones and can manage your symptoms long-term with options such as but not limited to imaging, medications or physical therapy      ED Prescriptions     Medication  Sig Dispense Auth. Provider   predniSONE (DELTASONE) 20 MG tablet Take 2 tablets (40 mg total) by mouth daily. 10 tablet Salli Quarry R, NP   cyclobenzaprine (FLEXERIL) 10 MG tablet Take  1 tablet (10 mg total) by mouth at bedtime. 10 tablet Valinda Hoar, NP      PDMP not reviewed this encounter.   Valinda Hoar, NP 06/20/21 1601

## 2021-06-20 NOTE — Discharge Instructions (Signed)
Your pain is most likely caused by irritation to the muscles or ligaments.   Starting tomorrow begin prednisone every morning with food for 5 days  Can sue flexeril at bedtime for additional comfort, be mindful this medication may make you drowsy   You may use heating pad in 15 minute intervals as needed for additional comfort, within the first 2-3 days you may find comfort in using ice in 10-15 minutes over affected area  Begin stretching affected area daily for 10 minutes as tolerated to further loosen muscles   When lying down place pillow underneath and between knees for support  If pain persist after recommended treatment or reoccurs if may be beneficial to follow up with orthopedic specialist for evaluation, this doctor specializes in the bones and can manage your symptoms long-term with options such as but not limited to imaging, medications or physical therapy

## 2021-09-14 ENCOUNTER — Emergency Department (HOSPITAL_BASED_OUTPATIENT_CLINIC_OR_DEPARTMENT_OTHER): Payer: Federal, State, Local not specified - PPO | Admitting: Radiology

## 2021-09-14 ENCOUNTER — Other Ambulatory Visit: Payer: Self-pay

## 2021-09-14 ENCOUNTER — Encounter (HOSPITAL_BASED_OUTPATIENT_CLINIC_OR_DEPARTMENT_OTHER): Payer: Self-pay

## 2021-09-14 ENCOUNTER — Emergency Department (HOSPITAL_BASED_OUTPATIENT_CLINIC_OR_DEPARTMENT_OTHER)
Admission: EM | Admit: 2021-09-14 | Discharge: 2021-09-14 | Disposition: A | Discharge status: Home / Self Care | Payer: Federal, State, Local not specified - PPO | Attending: Emergency Medicine | Admitting: Emergency Medicine

## 2021-09-14 DIAGNOSIS — M62830 Muscle spasm of back: Secondary | ICD-10-CM | POA: Diagnosis not present

## 2021-09-14 DIAGNOSIS — M545 Low back pain, unspecified: Secondary | ICD-10-CM

## 2021-09-14 DIAGNOSIS — Y92410 Unspecified street and highway as the place of occurrence of the external cause: Secondary | ICD-10-CM | POA: Diagnosis not present

## 2021-09-14 DIAGNOSIS — M62838 Other muscle spasm: Secondary | ICD-10-CM

## 2021-09-14 MED ORDER — CYCLOBENZAPRINE HCL 10 MG PO TABS: 10.0000 mg | ORAL_TABLET | Freq: Two times a day (BID) | ORAL | 0 refills | Status: AC | PRN

## 2021-09-14 MED ORDER — LIDOCAINE 5 % EX PTCH: 1.0000 | MEDICATED_PATCH | CUTANEOUS | 0 refills | Status: AC

## 2021-09-14 NOTE — ED Notes (Signed)
Pt teaching provided on medications that may cause drowsiness. Pt instructed not to drive or operate heavy machinery while taking the prescribed medication. Pt verbalized understanding.  ? ?Pt provided discharge instructions and prescription information. Pt was given the opportunity to ask questions and questions were answered. Discharge signature not obtained in the setting of the COVID-19 pandemic in order to reduce high touch surfaces.  ? ?
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
## 2022-09-08 ENCOUNTER — Encounter (HOSPITAL_COMMUNITY): Payer: Self-pay | Admitting: Emergency Medicine

## 2022-09-08 ENCOUNTER — Ambulatory Visit (HOSPITAL_COMMUNITY)
Admission: EM | Admit: 2022-09-08 | Discharge: 2022-09-08 | Discharge status: Against Medical Advice | Payer: Federal, State, Local not specified - PPO | Attending: Internal Medicine | Admitting: Internal Medicine

## 2022-09-08 DIAGNOSIS — R369 Urethral discharge, unspecified: Secondary | ICD-10-CM

## 2022-09-08 NOTE — ED Notes (Signed)
Patient left clinic after discussion of care with provider.

## 2022-09-08 NOTE — ED Triage Notes (Signed)
Reports being exposed to gonorrhea x 1 week ago. Developed a yellow/green discharge from penis. Denies testicular pain, rash, fever. Unsure of exactly when the symptoms developed. Denies pain. States he's not interested in doing any penile swabs or blood work for testing. Only wants treatment for the gonorrhea.
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
## 2022-09-09 ENCOUNTER — Encounter (HOSPITAL_COMMUNITY): Payer: Self-pay | Admitting: *Deleted

## 2022-09-09 ENCOUNTER — Emergency Department (HOSPITAL_COMMUNITY)
Admission: EM | Admit: 2022-09-09 | Discharge: 2022-09-09 | Disposition: A | Discharge status: Home / Self Care | Payer: Federal, State, Local not specified - PPO | Attending: Emergency Medicine | Admitting: Emergency Medicine

## 2022-09-09 DIAGNOSIS — R369 Urethral discharge, unspecified: Secondary | ICD-10-CM | POA: Diagnosis present

## 2022-09-09 DIAGNOSIS — Z202 Contact with and (suspected) exposure to infections with a predominantly sexual mode of transmission: Secondary | ICD-10-CM

## 2022-09-09 LAB — URINALYSIS, ROUTINE W REFLEX MICROSCOPIC
Bacteria, UA: NONE SEEN
Bilirubin Urine: NEGATIVE
Glucose, UA: NEGATIVE mg/dL
Hgb urine dipstick: NEGATIVE
Ketones, ur: 20 mg/dL — AB
Nitrite: NEGATIVE
Protein, ur: 30 mg/dL — AB
Specific Gravity, Urine: 1.023 (ref 1.005–1.030)
WBC, UA: >50 WBC/hpf — ABNORMAL HIGH (ref 0–5)
pH: 7 (ref 5.0–8.0)

## 2022-09-09 MED ORDER — LIDOCAINE HCL 1 % IJ SOLN
INTRAMUSCULAR | Status: AC
  Filled 2022-09-09: qty 20

## 2022-09-09 MED ORDER — DOXYCYCLINE HYCLATE 100 MG PO TABS
100.0000 mg | ORAL_TABLET | Freq: Once | ORAL | Status: AC
  Administered 2022-09-09: 100 mg via ORAL
  Filled 2022-09-09: qty 1

## 2022-09-09 MED ORDER — CEFTRIAXONE SODIUM 1 G IJ SOLR
500.0000 mg | Freq: Once | INTRAMUSCULAR | Status: AC
  Administered 2022-09-09: 500 mg via INTRAMUSCULAR
  Filled 2022-09-09: qty 10

## 2022-09-09 MED ORDER — DOXYCYCLINE HYCLATE 100 MG PO CAPS: 100.0000 mg | ORAL_CAPSULE | Freq: Two times a day (BID) | ORAL | 0 refills | Status: DC

## 2022-09-09 MED ORDER — LIDOCAINE HCL (PF) 1 % IJ SOLN
INTRAMUSCULAR | Status: AC
  Filled 2022-09-09: qty 30

## 2022-09-09 NOTE — ED Provider Notes (Signed)
[place] EMERGENCY DEPARTMENT AT [facility] Provider Note   CSN: [id number] Arrival date & time: 09/09/22  1248     History  No chief complaint on file.   Seth Cunningham is a 42 y.o. male.  HPI   42 year old male presents emergency department with complaints of penile discharge and dysuria.  Patient reports known exposure to STD with partner testing positive for gonorrhea.  He states that he has had noticeable symptoms for the past 2 to 3 days but has been possibly symptomatic since before then.  Describes discharge as green/yellow and milky in appearance.  Denies fever, chills, night sweats, abdominal pain, testicular pain, observable rash.  Patient is currently declining testing for HIV/syphilis and only wants urine sample and treatment.  No significant pertinent past medical history.  Home Medications Prior to Admission medications   Medication Sig Start Date End Date Taking? Authorizing Provider  doxycycline (VIBRAMYCIN) 100 MG capsule Take 1 capsule (100 mg total) by mouth 2 (two) times daily. 09/09/22  Yes Dion Saucier A, PA  cyclobenzaprine (FLEXERIL) 10 MG tablet Take 1 tablet (10 mg total) by mouth at bedtime. 06/20/21   White, Leitha Schuller, NP  cyclobenzaprine (FLEXERIL) 10 MG tablet Take 1 tablet (10 mg total) by mouth 2 (two) times daily as needed for muscle spasms. 09/14/21   Tegeler, Gwenyth Allegra, MD  ibuprofen (ADVIL) 800 MG tablet Take 1 tablet (800 mg total) by mouth 3 (three) times daily with meals. Patient taking differently: Take 800 mg by mouth every 8 (eight) hours as needed for moderate pain.  05/13/19   Vanessa Kick, MD  lidocaine (LIDODERM) 5 % Place 1 patch onto the skin daily. Remove & Discard patch within 12 hours or as directed by MD 09/14/21   Tegeler, Gwenyth Allegra, MD  lidocaine (XYLOCAINE) 2 % solution Use as directed 15 mLs in the mouth or throat every 4 (four) hours as needed for mouth pain. 07/09/20   Petrucelli, Samantha R, PA-C   naproxen (NAPROSYN) 500 MG tablet Take 1 tablet (500 mg total) by mouth 2 (two) times daily as needed for moderate pain. 07/09/20   Petrucelli, Samantha R, PA-C  predniSONE (DELTASONE) 20 MG tablet Take 2 tablets (40 mg total) by mouth daily. 06/20/21   Hans Eden, NP      Allergies    Patient has no known allergies.    Review of Systems   Review of Systems  All other systems reviewed and are negative.   Physical Exam Updated Vital Signs BP (!) 134/96 (BP Location: Right Arm)   Pulse 100   Temp 97.6 F (36.4 C) (Oral)   Resp 18   SpO2 99%  Physical Exam Vitals and nursing note reviewed.  Constitutional:      General: He is not in acute distress.    Appearance: He is well-developed.  HENT:     Head: Normocephalic and atraumatic.  Eyes:     Conjunctiva/sclera: Conjunctivae normal.  Cardiovascular:     Rate and Rhythm: Normal rate and regular rhythm.     Heart sounds: No murmur heard. Pulmonary:     Effort: Pulmonary effort is normal. No respiratory distress.     Breath sounds: Normal breath sounds.  Abdominal:     Palpations: Abdomen is soft.     Tenderness: There is no abdominal tenderness.  Musculoskeletal:        General: No swelling.     Cervical back: Neck supple.  Skin:    General: Skin  is warm and dry.     Capillary Refill: Capillary refill takes less than 2 seconds.  Neurological:     Mental Status: He is alert.  Psychiatric:        Mood and Affect: Mood normal.     ED Results / Procedures / Treatments   Labs (all labs ordered are listed, but only abnormal results are displayed) Labs Reviewed  URINE CULTURE  URINALYSIS, ROUTINE W REFLEX MICROSCOPIC  GC/CHLAMYDIA PROBE AMP ([place]) NOT AT [facility]    EKG None  Radiology No results found.  Procedures Procedures    Medications Ordered in ED Medications  cefTRIAXone (ROCEPHIN) injection 500 mg (500 mg Intramuscular Given 09/09/22 1328)  doxycycline (VIBRA-TABS) tablet 100 mg (100 mg  Oral Given 09/09/22 1328)  lidocaine (PF) (XYLOCAINE) 1 % injection (  Given 09/09/22 1335)    ED Course/ Medical Decision Making/ A&P                             Medical Decision Making Amount and/or Complexity of Data Reviewed Labs: ordered.  Risk Prescription drug management.   This patient presents to the ED for concern of STD exposure, this involves an extensive number of treatment options, and is a complaint that carries with it a high risk of complications and morbidity.  The differential diagnosis includes gonorrhea, chlamydia, HIV, syphilis, Fitz-Hugh Curtis syndrome, Reiter's syndrome   Co morbidities that complicate the patient evaluation  See HPI   Additional history obtained:  Additional history obtained from EMR External records from outside source obtained and reviewed including hospital records   Lab Tests:  I Ordered, and personally interpreted labs.  The pertinent results include: GC/committee, UA pending.   Imaging Studies ordered:  N/a   Cardiac Monitoring: / EKG:  The patient was maintained on a cardiac monitor.  I personally viewed and interpreted the cardiac monitored which showed an underlying rhythm of: Sinus rhythm   Consultations Obtained:  N/a   Problem List / ED Course / Critical interventions / Medication management  STD exposure I ordered medication including doxycycline, Rocephin  Reevaluation of the patient after these medicines showed that the patient improved I have reviewed the patients home medicines and have made adjustments as needed   Social Determinants of Health:  Former cigarette use.  Denies illicit drug use.   Test / Admission - Considered:  STD exposure Vitals signs within normal range and stable throughout visit. Laboratory studies significant for: See above Patient with history of STD exposure with partner testing positive for gonorrhea.  Empiric treatment given while in the emergency department.   Patient without systemic fever, abdominal pain, nausea, vomiting, testicular pain, rash.  Further workup deemed unnecessary at this time.  Patient declined HIV as well as syphilis testing while in the emergency department.  Will be given doxycycline twice twice daily for the next 7 days.  Patient recommended sexual abstinence as well as follow-up with primary care for reassessment.  Treatment plan discussed at length with patient and he acknowledged understanding was agreeable to said plan. Worrisome signs and symptoms were discussed with the patient, and the patient acknowledged understanding to return to the ED if noticed. Patient was stable upon discharge.          Final Clinical Impression(s) / ED Diagnoses Final diagnoses:  STD exposure  Penile discharge    Rx / DC Orders ED Discharge Orders  Ordered    doxycycline (VIBRAMYCIN) 100 MG capsule  2 times daily        09/09/22 1310              Wilnette Kales, [place] 09/09/22 1406    Tretha Sciara, MD 09/09/22 1506

## 2022-09-09 NOTE — ED Notes (Signed)
Pt states he has a STD, discharge and burning on urination.

## 2022-09-09 NOTE — Discharge Instructions (Addendum)
Follow MyChart for the results of your STD testing.  We have treated in the emergency department with 1 shot of medication pill for presumed STD.  Continue taking doxycycline twice daily for the next 7 days.  As discussed, abstain from sexual intercourse until completion of antibiotics as you are contagious.  Please do not hesitate to return to emergency department for worrisome signs and symptoms we discussed become apparent.

## 2022-09-10 LAB — URINE CULTURE: Culture: <10000 — AB

## 2022-09-11 LAB — GC/CHLAMYDIA PROBE AMP (~~LOC~~) NOT AT ARMC
Chlamydia: NEGATIVE
Comment: NEGATIVE
Comment: NORMAL
Neisseria Gonorrhea: POSITIVE — AB

## 2023-02-25 ENCOUNTER — Emergency Department (HOSPITAL_COMMUNITY): Payer: Federal, State, Local not specified - PPO

## 2023-02-25 ENCOUNTER — Emergency Department (HOSPITAL_COMMUNITY)
Admission: EM | Admit: 2023-02-25 | Discharge: 2023-02-25 | Disposition: A | Discharge status: Home / Self Care | Payer: Federal, State, Local not specified - PPO | Attending: Emergency Medicine | Admitting: Emergency Medicine

## 2023-02-25 ENCOUNTER — Encounter (HOSPITAL_COMMUNITY): Payer: Self-pay | Admitting: Emergency Medicine

## 2023-02-25 ENCOUNTER — Other Ambulatory Visit: Payer: Self-pay

## 2023-02-25 DIAGNOSIS — S62347A Nondisplaced fracture of base of fifth metacarpal bone. left hand, initial encounter for closed fracture: Secondary | ICD-10-CM | POA: Diagnosis not present

## 2023-02-25 DIAGNOSIS — W2201XA Walked into wall, initial encounter: Secondary | ICD-10-CM | POA: Insufficient documentation

## 2023-02-25 DIAGNOSIS — S6992XA Unspecified injury of left wrist, hand and finger(s), initial encounter: Secondary | ICD-10-CM | POA: Diagnosis present

## 2023-02-25 NOTE — Discharge Instructions (Signed)
Keep the splint on.  Call Dr. Luvenia Starch office to schedule a follow-up appointment

## 2023-02-25 NOTE — ED Provider Notes (Signed)
[place] EMERGENCY DEPARTMENT AT [facility] Provider Note   CSN: [id number] Arrival date & time: 02/25/23  4098     History  Chief Complaint  Patient presents with   Hand Injury    Seth Cunningham is a 42 y.o. male.   Hand Injury    Patient presents to the ED with complaints of a hand injury.  Patient punched a wall this morning.  Since that time has had some pain on the ulnar aspect of his left hand.  Patient denies any numbness or weakness.  No difficulty moving his fingers  Home Medications Prior to Admission medications   Medication Sig Start Date End Date Taking? Authorizing Provider  cyclobenzaprine (FLEXERIL) 10 MG tablet Take 1 tablet (10 mg total) by mouth at bedtime. 06/20/21   White, Elita Boone, NP  cyclobenzaprine (FLEXERIL) 10 MG tablet Take 1 tablet (10 mg total) by mouth 2 (two) times daily as needed for muscle spasms. 09/14/21   Tegeler, Canary Brim, MD  doxycycline (VIBRAMYCIN) 100 MG capsule Take 1 capsule (100 mg total) by mouth 2 (two) times daily. 09/09/22   Peter Garter, PA  ibuprofen (ADVIL) 800 MG tablet Take 1 tablet (800 mg total) by mouth 3 (three) times daily with meals. Patient taking differently: Take 800 mg by mouth every 8 (eight) hours as needed for moderate pain.  05/13/19   Mardella Layman, MD  lidocaine (LIDODERM) 5 % Place 1 patch onto the skin daily. Remove & Discard patch within 12 hours or as directed by MD 09/14/21   Tegeler, Canary Brim, MD  lidocaine (XYLOCAINE) 2 % solution Use as directed 15 mLs in the mouth or throat every 4 (four) hours as needed for mouth pain. 07/09/20   Petrucelli, Samantha R, PA-C  naproxen (NAPROSYN) 500 MG tablet Take 1 tablet (500 mg total) by mouth 2 (two) times daily as needed for moderate pain. 07/09/20   Petrucelli, Samantha R, PA-C  predniSONE (DELTASONE) 20 MG tablet Take 2 tablets (40 mg total) by mouth daily. 06/20/21   Valinda Hoar, NP      Allergies    Patient has no known  allergies.    Review of Systems   Review of Systems  Physical Exam Updated Vital Signs BP 137/74   Pulse 62   Temp 97.7 F (36.5 C)   Resp 18   Wt 90 kg   SpO2 92%   BMI 29.30 kg/m  Physical Exam Vitals and nursing note reviewed.  Constitutional:      General: He is not in acute distress.    Appearance: He is well-developed.  HENT:     Head: Normocephalic and atraumatic.     Right Ear: External ear normal.     Left Ear: External ear normal.  Eyes:     General: No scleral icterus.       Right eye: No discharge.        Left eye: No discharge.     Conjunctiva/sclera: Conjunctivae normal.  Neck:     Trachea: No tracheal deviation.  Cardiovascular:     Rate and Rhythm: Normal rate.  Pulmonary:     Effort: Pulmonary effort is normal. No respiratory distress.     Breath sounds: No stridor.  Abdominal:     General: There is no distension.  Musculoskeletal:        General: Tenderness present. No swelling or deformity.     Cervical back: Neck supple.     Comments: Tenderness to palpation  along fifth metacarpal of the left hand, distal neurovascular intact, full range of motion  Skin:    General: Skin is warm and dry.     Findings: No rash.  Neurological:     Mental Status: He is alert. Mental status is at baseline.     Cranial Nerves: No dysarthria or facial asymmetry.     Motor: No seizure activity.     ED Results / Procedures / Treatments   Labs (all labs ordered are listed, but only abnormal results are displayed) Labs Reviewed - No data to display  EKG None  Radiology DG Hand Complete Left  Result Date: 02/25/2023 CLINICAL DATA:  Injury.  Patient punched wall. EXAM: LEFT HAND - COMPLETE 3+ VIEW COMPARISON:  None Available. FINDINGS: Soft tissue edema is identified about the metacarpal bones. Nondisplaced fracture is noted at the base of the fifth metacarpal bone. The fracture fragments are in anatomic alignment. No additional acute fracture or subluxation.  IMPRESSION: Nondisplaced fracture at the base of the fifth metacarpal bone. Electronically Signed   By: Signa Kell M.D.   On: 02/25/2023 09:32    Procedures Procedures    Medications Ordered in ED Medications - No data to display  ED Course/ Medical Decision Making/ A&P                             Medical Decision Making Problems Addressed: Closed nondisplaced fracture of base of fifth metacarpal bone of left hand, initial encounter: acute illness or injury that poses a threat to life or bodily functions  Amount and/or Complexity of Data Reviewed Radiology: ordered and independent interpretation performed.  Patient presented to the ER for evaluation of a hand injury.  X-rays show nondisplaced fracture at the base of the fifth metacarpal bone.  Patient is neurovascularly intact.  Will place in a splint and have him follow-up with orthopedics as an outpatient        Final Clinical Impression(s) / ED Diagnoses Final diagnoses:  Closed nondisplaced fracture of base of fifth metacarpal bone of left hand, initial encounter    Rx / DC Orders ED Discharge Orders     None         Linwood Dibbles, MD 02/25/23 [phone]

## 2023-02-25 NOTE — ED Triage Notes (Signed)
Pt complains of left hand pain after punching a wall this morning. Pt is able to move fingers. Swelling noted to outside of left hand.

## 2023-02-25 NOTE — Progress Notes (Signed)
Orthopedic Tech Progress Note Patient Details:  Seth Cunningham [birth year] [id number]  Plaster ulnar gutter splint applied to L hand without complication. Pt states he has no areas of tightness or irritation from the splint. Encouraged ice and elevation to help with any pain/swelling.  Ortho Devices Type of Ortho Device: Ulna gutter splint Ortho Device/Splint Location: LUE Ortho Device/Splint Interventions: Ordered, Adjustment, Application   Post Interventions Patient Tolerated: Well Instructions Provided: Care of device  Syvanna Ciolino Carmine Savoy 02/25/2023, 11:37 AM
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
## 2024-03-19 ENCOUNTER — Emergency Department (HOSPITAL_COMMUNITY)
Admission: EM | Admit: 2024-03-19 | Discharge: 2024-03-19 | Disposition: A | Discharge status: Home / Self Care | Attending: Emergency Medicine | Admitting: Emergency Medicine

## 2024-03-19 ENCOUNTER — Other Ambulatory Visit: Payer: Self-pay

## 2024-03-19 DIAGNOSIS — Z202 Contact with and (suspected) exposure to infections with a predominantly sexual mode of transmission: Secondary | ICD-10-CM | POA: Insufficient documentation

## 2024-03-19 DIAGNOSIS — R369 Urethral discharge, unspecified: Secondary | ICD-10-CM | POA: Diagnosis present

## 2024-03-19 LAB — URINALYSIS, ROUTINE W REFLEX MICROSCOPIC
Bacteria, UA: NONE SEEN
Bilirubin Urine: NEGATIVE
Glucose, UA: NEGATIVE mg/dL
Hgb urine dipstick: NEGATIVE
Ketones, ur: NEGATIVE mg/dL
Nitrite: NEGATIVE
Protein, ur: NEGATIVE mg/dL
Specific Gravity, Urine: 1.016 (ref 1.005–1.030)
pH: 7 (ref 5.0–8.0)

## 2024-03-19 MED ORDER — DOXYCYCLINE HYCLATE 100 MG PO CAPS: 100.0000 mg | ORAL_CAPSULE | Freq: Two times a day (BID) | ORAL | 0 refills | Status: AC

## 2024-03-19 MED ORDER — DOXYCYCLINE HYCLATE 100 MG PO TABS
100.0000 mg | ORAL_TABLET | Freq: Once | ORAL | Status: AC
  Administered 2024-03-19: 100 mg via ORAL
  Filled 2024-03-19: qty 1

## 2024-03-19 MED ORDER — CEFTRIAXONE SODIUM 1 G IJ SOLR
500.0000 mg | Freq: Once | INTRAMUSCULAR | Status: AC
  Administered 2024-03-19: 500 mg via INTRAMUSCULAR
  Filled 2024-03-19: qty 10

## 2024-03-19 NOTE — ED Notes (Signed)
 Steady gait, ambulatory from w/r to exam room.

## 2024-03-19 NOTE — ED Provider Triage Note (Signed)
 Emergency Medicine Provider Triage Evaluation Note  Seth Cunningham , a 43 y.o. male  was evaluated in triage.  Pt complains of partner letter him know that she tested positive for gonorrhea and chlamydia. Patient starts that his symptoms started this week with yellow-like discharge. States that he wants treatment for gonorrhea and chlamydia, denies wanting to be tested for HIV, syphilis, or other STD. Denies fever, chills, chest pain, shortness of breath, etc.   Review of Systems  Positive: Penile discharge, penile discomfort Negative: Fever, chills, abdominal pain, testicular pain, hematuria  Physical Exam  There were no vitals taken for this visit. Gen:   Awake, no distress   Resp:  Normal effort  MSK:   Moves extremities without difficulty  Other:    Medical Decision Making  Medically screening exam initiated at 3:07 PM.  Appropriate orders placed.  Seth Cunningham was informed that the remainder of the evaluation will be completed by another provider, this initial triage assessment does not replace that evaluation, and the importance of remaining in the ED until their evaluation is complete.  Orders: G/C, urine, ceftriaxone  and doxycycline   Testing offered for HIV and syphilis but patient declined at this time.   Seth Delmonico F, PA-C 03/19/24 2315

## 2024-03-19 NOTE — ED Provider Notes (Cosign Needed Addendum)
 [place] EMERGENCY DEPARTMENT AT [facility] Provider Note   CSN: [id number] Arrival date & time: 03/19/24  1425     Patient presents with: SEXUALLY TRANSMITTED DISEASE   Seth Cunningham is a 43 y.o. male who presents to the emergency department with a chief complaint of penile discharge.  Patient was informed by his partner that she was infected with an STD so he decided to come to the emergency department for treatment.  Denies fever or chills.  Patient states that his partner has already received treatment.  Patient states that he has 1 male partner, denies using protection against STDs. Denies significant past medical history.   HPI     Prior to Admission medications   Medication Sig Start Date End Date Taking? Authorizing Provider  doxycycline  (VIBRAMYCIN ) 100 MG capsule Take 1 capsule (100 mg total) by mouth 2 (two) times daily for 7 days. 03/19/24 03/26/24 Yes Curley Fayette F, PA-C  cyclobenzaprine  (FLEXERIL ) 10 MG tablet Take 1 tablet (10 mg total) by mouth at bedtime. 06/20/21   White, Shelba SAUNDERS, NP  cyclobenzaprine  (FLEXERIL ) 10 MG tablet Take 1 tablet (10 mg total) by mouth 2 (two) times daily as needed for muscle spasms. 09/14/21   Tegeler, Lonni PARAS, MD  ibuprofen  (ADVIL ) 800 MG tablet Take 1 tablet (800 mg total) by mouth 3 (three) times daily with meals. Patient taking differently: Take 800 mg by mouth every 8 (eight) hours as needed for moderate pain.  05/13/19   Rolinda Rogue, MD  lidocaine  (LIDODERM ) 5 % Place 1 patch onto the skin daily. Remove & Discard patch within 12 hours or as directed by MD 09/14/21   Tegeler, Lonni PARAS, MD  lidocaine  (XYLOCAINE ) 2 % solution Use as directed 15 mLs in the mouth or throat every 4 (four) hours as needed for mouth pain. 07/09/20   Petrucelli, Samantha R, PA-C  naproxen  (NAPROSYN ) 500 MG tablet Take 1 tablet (500 mg total) by mouth 2 (two) times daily as needed for moderate pain. 07/09/20   Petrucelli, Samantha R,  PA-C  predniSONE  (DELTASONE ) 20 MG tablet Take 2 tablets (40 mg total) by mouth daily. 06/20/21   Teresa Shelba SAUNDERS, NP    Allergies: Patient has no known allergies.    Review of Systems  Genitourinary:  Positive for penile discharge.    Updated Vital Signs BP (!) 160/107 (BP Location: Left Wrist)   Pulse 70   Temp 97.8 F (36.6 C) (Oral)   Resp 15   Ht 5' 9 (1.753 m)   Wt 95.3 kg   SpO2 98%   BMI 31.01 kg/m   Physical Exam Vitals and nursing note reviewed.  Constitutional:      General: He is awake. He is not in acute distress.    Appearance: Normal appearance. He is not ill-appearing, toxic-appearing or diaphoretic.  HENT:     Head: Normocephalic and atraumatic.  Eyes:     General: No scleral icterus. Pulmonary:     Effort: Pulmonary effort is normal. No respiratory distress.  Abdominal:     General: Abdomen is flat.     Palpations: Abdomen is soft.     Tenderness: There is no abdominal tenderness. There is no guarding or rebound.  Musculoskeletal:        General: Normal range of motion.  Skin:    General: Skin is warm and dry.     Capillary Refill: Capillary refill takes less than 2 seconds.  Neurological:     General: No focal  deficit present.     Mental Status: He is alert and oriented to person, place, and time.  Psychiatric:        Mood and Affect: Mood normal.        Behavior: Behavior normal. Behavior is cooperative.     (all labs ordered are listed, but only abnormal results are displayed) Labs Reviewed  URINALYSIS, ROUTINE W REFLEX MICROSCOPIC - Abnormal; Notable for the following components:      Result Value   Leukocytes,Ua SMALL (*)    All other components within normal limits  GC/CHLAMYDIA PROBE AMP ([place]) NOT AT [facility]    EKG: None  Radiology: No results found.   Procedures   Medications Ordered in the ED  cefTRIAXone  (ROCEPHIN ) injection 500 mg (500 mg Intramuscular Given 03/19/24 1523)  doxycycline  (VIBRA -TABS) tablet 100  mg (100 mg Oral Given 03/19/24 1523)                                    Medical Decision Making Amount and/or Complexity of Data Reviewed Labs: ordered.  Risk Prescription drug management.   Patient presents to the ED for concern of penile discharge, this involves an extensive number of treatment options, and is a complaint that carries with it a high risk of complications and morbidity.  The differential diagnosis includes gonorrhea, chlamydia, urinary tract infection, etc.   Co morbidities that complicate the patient evaluation  None   Lab Tests:  I Ordered, and personally interpreted labs.  The pertinent results include: Urinalysis remarkable only for small leukocyte Estrace, doubt urinary tract infection, gonorrhea chlamydia pending   Medicines ordered and prescription drug management:  I ordered medication including ceftriaxone  and doxycycline  for STD prophylaxis Reevaluation of the patient after these medicines showed that the patient stayed the same I have reviewed the patients home medicines and have made adjustments as needed   Test Considered:  HIV, syphilis testing: Declined at this time as patient states that he only wants to be treated for gonorrhea and chlamydia.  Denies testing for other STDs at this time.  Educated the patient that he can return to the emergency department at any time for further workup and further testing.  Patient also declined GU exam.   Critical Interventions:  None   Problem List / ED Course:  43 year old male, exposure to STD, patient tested positive and already received treatment, patient complaining of penile discharge Patient declined STD testing other than gonorrhea or chlamydia, UA and G/C ordered, patient also declined GU exam Patient prophylactically treated with Rocephin  and doxycycline  in the emergency department Urinalysis not overwhelming for infection Return precautions given Patient prescribed outpatient course of  doxycycline  Patient discharged   Reevaluation:  After the interventions noted above, I reevaluated the patient and found that they have :stayed the same   Social Determinants of Health:  None   Dispostion:  After consideration of the diagnostic results and the patients response to treatment, I feel that the patent would benefit from discharge and outpatient therapy as prescribed.  Return precautions given.  Patient discharged..       Final diagnoses:  STD exposure  Penile discharge    ED Discharge Orders          Ordered    doxycycline  (VIBRAMYCIN ) 100 MG capsule  2 times daily        03/19/24 1845  Samyrah Bruster F, PA-C 03/19/24 2320    Anessia Oakland F, PA-C 03/19/24 2330    Randol Simmonds, MD 03/20/24 [phone]

## 2024-03-19 NOTE — Discharge Instructions (Addendum)
 It was a pleasure taking care of you today.  Based on your history and physical exam I feel you are safe for discharge.  Today you declined STD testing other than for gonorrhea and chlamydia.  If these tests are positive the lab will call you.  Today you were treated with antibiotics for both of these infections.  You have been prescribed an outpatient antibiotic, please pick this up up from the pharmacy and complete the entire course taking it as instructed.  If symptoms worsen or persist recommend follow-up with primary care provider in 3 to 4 days or return to the emergency department.  If you experience any of the following symptoms including but not limited to fever, chills, blood in urine, severe abdominal pain, or other concerning symptom please return emergency department.

## 2024-03-19 NOTE — ED Triage Notes (Signed)
 Patient to ED by POV with c/o STD. His partner informed him that she was infected and he came to ED for treatment. He reports discharge with urination.

## 2024-03-20 LAB — GC/CHLAMYDIA PROBE AMP (~~LOC~~) NOT AT ARMC
Chlamydia: NEGATIVE
Comment: NEGATIVE
Comment: NORMAL
Neisseria Gonorrhea: POSITIVE — AB

## 2024-03-21 ENCOUNTER — Ambulatory Visit (HOSPITAL_COMMUNITY): Payer: Self-pay
